# Patient Record
Sex: Male | Born: 1995 | Race: White | Hispanic: No | Marital: Single | Smoking: Former smoker
Health system: Southern US, Community
[De-identification: ages and names within clinical notes are randomized; demographics above are authoritative.]

## PROBLEM LIST (undated history)

## (undated) DIAGNOSIS — E119 Type 2 diabetes mellitus without complications: Secondary | ICD-10-CM

---
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
## 2017-08-25 ENCOUNTER — Emergency Department
Admission: EM | Admit: 2017-08-25 | Discharge: 2017-08-25 | Disposition: A | Discharge status: Home / Self Care | Payer: Medicaid Other | Attending: Emergency Medicine | Admitting: Emergency Medicine

## 2017-08-25 DIAGNOSIS — F17200 Nicotine dependence, unspecified, uncomplicated: Secondary | ICD-10-CM | POA: Diagnosis not present

## 2017-08-25 DIAGNOSIS — Z9114 Patient's other noncompliance with medication regimen: Secondary | ICD-10-CM | POA: Diagnosis not present

## 2017-08-25 DIAGNOSIS — E1165 Type 2 diabetes mellitus with hyperglycemia: Secondary | ICD-10-CM | POA: Diagnosis not present

## 2017-08-25 DIAGNOSIS — R350 Frequency of micturition: Secondary | ICD-10-CM | POA: Diagnosis present

## 2017-08-25 DIAGNOSIS — R739 Hyperglycemia, unspecified: Secondary | ICD-10-CM

## 2017-08-25 HISTORY — DX: Type 2 diabetes mellitus without complications: E11.9

## 2017-08-25 LAB — BASIC METABOLIC PANEL
Anion gap: 12 (ref 5–15)
BUN: 16 mg/dL (ref 6–20)
CALCIUM: 9.6 mg/dL (ref 8.9–10.3)
CHLORIDE: 100 mmol/L — AB (ref 101–111)
CO2: 26 mmol/L (ref 22–32)
CREATININE: 0.68 mg/dL (ref 0.61–1.24)
Glucose, Bld: 283 mg/dL — ABNORMAL HIGH (ref 65–99)
Potassium: 4.2 mmol/L (ref 3.5–5.1)
SODIUM: 138 mmol/L (ref 135–145)

## 2017-08-25 LAB — GLUCOSE, CAPILLARY
Glucose-Capillary: 328 mg/dL — ABNORMAL HIGH (ref 65–99)
Glucose-Capillary: 258 mg/dL — ABNORMAL HIGH (ref 65–99)

## 2017-08-25 LAB — URINALYSIS, COMPLETE (UACMP) WITH MICROSCOPIC
Bacteria, UA: NONE SEEN
Bilirubin Urine: NEGATIVE
HGB URINE DIPSTICK: NEGATIVE
KETONES UR: 5 mg/dL — AB
LEUKOCYTES UA: NEGATIVE
Nitrite: NEGATIVE
PH: 7 (ref 5.0–8.0)
Protein, ur: NEGATIVE mg/dL
SQUAMOUS EPITHELIAL / LPF: NONE SEEN
Specific Gravity, Urine: 1.03 (ref 1.005–1.030)

## 2017-08-25 LAB — CBC
HCT: 46 % (ref 40.0–52.0)
Hemoglobin: 16.1 g/dL (ref 13.0–18.0)
MCH: 30.5 pg (ref 26.0–34.0)
MCHC: 34.9 g/dL (ref 32.0–36.0)
MCV: 87.5 fL (ref 80.0–100.0)
PLATELETS: 346 10*3/uL (ref 150–440)
RBC: 5.26 MIL/uL (ref 4.40–5.90)
RDW: 12.8 % (ref 11.5–14.5)
WBC: 9.8 10*3/uL (ref 3.8–10.6)

## 2017-08-25 LAB — BLOOD GAS, VENOUS
Acid-Base Excess: 0.8 mmol/L (ref 0.0–2.0)
BICARBONATE: 24.5 mmol/L (ref 20.0–28.0)
O2 SAT: 87.2 %
PATIENT TEMPERATURE: 37
PO2 VEN: 51 mmHg — AB (ref 32.0–45.0)
pCO2, Ven: 36 mmHg — ABNORMAL LOW (ref 44.0–60.0)
pH, Ven: 7.44 — ABNORMAL HIGH (ref 7.250–7.430)

## 2017-08-25 MED ORDER — SODIUM CHLORIDE 0.9 % IV BOLUS (SEPSIS)
1000.0000 mL | Freq: Once | INTRAVENOUS | Status: AC
  Administered 2017-08-25: 1000 mL via INTRAVENOUS

## 2017-08-25 NOTE — ED Provider Notes (Signed)
[facility] Emergency Department Provider Note  ____________________________________________   First MD Initiated Contact with Patient 08/25/17 2141     (approximate)  I have reviewed the triage vital signs and the nursing notes.   HISTORY  Chief Complaint Hyperglycemia   HPI Marcus Santos is a 21 y.o. male who comes to the emergency department with roughly 1 week of polyuria, polydipsia, and vague tingling in his hands. His past medical history of type 1 diabetes and he is intermittently compliant with his insulin. He does take his Lantus every night but he does not take his sliding scale very well.history of symptoms have been insidious in onset gradually slowly progressive.   Past Medical History:  Diagnosis Date  . Diabetes mellitus without complication (HCC)     There are no active problems to display for this patient.   History reviewed. No pertinent surgical history.  Prior to Admission medications   Not on File    Allergies Dayquil [pseudoephedrine-apap-dm] and Nyquil multi-symptom [pseudoeph-doxylamine-dm-apap]  No family history on file.  Social History Social History  Substance Use Topics  . Smoking status: Current Some Day Smoker  . Smokeless tobacco: Current User  . Alcohol use No    Review of Systems Constitutional: No fever/chills Eyes: No visual changes. ENT: No sore throat. Cardiovascular: Denies chest pain. Respiratory: Denies shortness of breath. Gastrointestinal: No abdominal pain.  No nausea, no vomiting.  No diarrhea.  No constipation. Genitourinary: Negative for dysuria. Musculoskeletal: Negative for back pain. Skin: Negative for rash. Neurological: Negative for headaches, focal weakness or numbness.   ____________________________________________   PHYSICAL EXAM:  VITAL SIGNS: ED Triage Vitals  Enc Vitals Group     BP 08/25/17 2116 134/71     Pulse Rate 08/25/17 2116 67     Resp 08/25/17 2116  18     Temp 08/25/17 2116 98.9 F (37.2 C)     Temp Source 08/25/17 2116 Oral     SpO2 08/25/17 2116 100 %     Weight 08/25/17 2123 175 lb (79.4 kg)     Height 08/25/17 2123  (1.702 m)     Head Circumference --      Peak Flow --      Pain Score 08/25/17 2122 7     Pain Loc --      Pain Edu? --      Excl. in GC? --     Constitutional: alert and oriented 4 well appearing nontoxic no diaphoresis speaks full clear sentences Eyes: PERRL EOMI. Head: Atraumatic. Nose: No congestion/rhinnorhea. Mouth/Throat: No trismus Neck: No stridor.   Cardiovascular: Normal rate, regular rhythm. Grossly normal heart sounds.  Good peripheral circulation. Respiratory: Normal respiratory effort.  No retractions. Lungs CTAB and moving good air Gastrointestinal: soft nontender Musculoskeletal: No lower extremity edema   Neurologic:  Normal speech and language. No gross focal neurologic deficits are appreciated. Skin:  Skin is warm, dry and intact. No rash noted. Psychiatric: Mood and affect are normal. Speech and behavior are normal.    ____________________________________________   DIFFERENTIAL includes but not limited to  hyperglycemia, diabetic ketoacidosis, HHS, mentation noncompliance ____________________________________________   LABS (all labs ordered are listed, but only abnormal results are displayed)  Labs Reviewed  BASIC METABOLIC PANEL - Abnormal; Notable for the following:       Result Value   Chloride 100 (*)    Glucose, Bld 283 (*)    All other components within normal limits  URINALYSIS, COMPLETE (UACMP) WITH  MICROSCOPIC - Abnormal; Notable for the following:    Color, Urine STRAW (*)    APPearance CLEAR (*)    Glucose, UA >=500 (*)    Ketones, ur 5 (*)    All other components within normal limits  BLOOD GAS, VENOUS - Abnormal; Notable for the following:    pH, Ven 7.44 (*)    pCO2, Ven 36 (*)    pO2, Ven 51.0 (*)    All other components within normal limits    GLUCOSE, CAPILLARY - Abnormal; Notable for the following:    Glucose-Capillary 328 (*)    All other components within normal limits  GLUCOSE, CAPILLARY - Abnormal; Notable for the following:    Glucose-Capillary 258 (*)    All other components within normal limits  CBC  CBG MONITORING, ED    blood work reviewed and interpreted by me shows elevated glucose with no signs of diabetic ketoacidosis __________________________________________  EKG   ____________________________________________  RADIOLOGY   ____________________________________________   PROCEDURES  Procedure(s) performed: no  Procedures  Critical Care performed: no  Observation: no ____________________________________________   INITIAL IMPRESSION / ASSESSMENT AND PLAN / ED COURSE  Pertinent labs & imaging results that were available during my care of the patient were reviewed by me and considered in my medical decision making (see chart for details).  The patient arrives comfortable appearing with no Kussmal breathing and no ketones on his breath. Blood work confirms elevated blood glucose with no signs of diabetic ketoacidosis. I've encouraged the patient to remain compliant with his medications and he verbalizes understanding and agreement. He does not need refills on any medications. He is discharged home in improved condition.      ____________________________________________   FINAL CLINICAL IMPRESSION(S) / ED DIAGNOSES  Final diagnoses:  Hyperglycemia  Nonadherence to medication      NEW MEDICATIONS STARTED DURING THIS VISIT:  New Prescriptions   No medications on file     Note:  This document was prepared using Dragon voice recognition software and may include unintentional dictation errors.     [name], MD 08/25/17 2311

## 2017-08-25 NOTE — ED Notes (Signed)
ED Provider at bedside. 

## 2017-08-25 NOTE — ED Triage Notes (Signed)
Patient is type 1 diabetic. Patient is medication noncompliant. Pt reports sugars over 300 at home today. Patient reports 'shocking' sensation along hands, face.  Patient reports blurred vision and malaise. Pt reports intermittent near syncopal episodes.

## 2017-08-25 NOTE — Discharge Instructions (Signed)
Please make sure you take your insulin as prescribed. It is extremely dangerous to have a hemoglobin A1c as high as yours is. follow-up with your primary care physician as scheduled and return to the emergency department for any concerns.  It was a pleasure to take care of you today, and thank you for coming to our emergency department.  If you have any questions or concerns before leaving please ask the nurse to grab me and I'm more than happy to go through your aftercare instructions again.  If you were prescribed any opioid pain medication today such as Norco, Vicodin, Percocet, morphine, hydrocodone, or oxycodone please make sure you do not drive when you are taking this medication as it can alter your ability to drive safely.  If you have any concerns once you are home that you are not improving or are in fact getting worse before you can make it to your follow-up appointment, please do not hesitate to call 911 and come back for further evaluation.  [name], MD  Results for orders placed or performed during the hospital encounter of 08/25/17  Basic metabolic panel  Result Value Ref Range   Sodium 138 135 - 145 mmol/L   Potassium 4.2 3.5 - 5.1 mmol/L   Chloride 100 (L) 101 - 111 mmol/L   CO2 26 22 - 32 mmol/L   Glucose, Bld 283 (H) 65 - 99 mg/dL   BUN 16 6 - 20 mg/dL   Creatinine, Ser 1.61 0.61 - 1.24 mg/dL   Calcium 9.6 8.9 - 09.6 mg/dL   GFR calc non Af Amer >60 >60 mL/min   GFR calc Af Amer >60 >60 mL/min   Anion gap 12 5 - 15  CBC  Result Value Ref Range   WBC 9.8 3.8 - 10.6 K/uL   RBC 5.26 4.40 - 5.90 MIL/uL   Hemoglobin 16.1 13.0 - 18.0 g/dL   HCT 04.5 40.9 - 81.1 %   MCV 87.5 80.0 - 100.0 fL   MCH 30.5 26.0 - 34.0 pg   MCHC 34.9 32.0 - 36.0 g/dL   RDW 91.4 78.2 - 95.6 %   Platelets 346 150 - 440 K/uL  Urinalysis, Complete w Microscopic  Result Value Ref Range   Color, Urine STRAW (A) YELLOW   APPearance CLEAR (A) CLEAR   Specific Gravity, Urine 1.030 1.005 -  1.030   pH 7.0 5.0 - 8.0   Glucose, UA >=500 (A) NEGATIVE mg/dL   Hgb urine dipstick NEGATIVE NEGATIVE   Bilirubin Urine NEGATIVE NEGATIVE   Ketones, ur 5 (A) NEGATIVE mg/dL   Protein, ur NEGATIVE NEGATIVE mg/dL   Nitrite NEGATIVE NEGATIVE   Leukocytes, UA NEGATIVE NEGATIVE   RBC / HPF 0-5 0 - 5 RBC/hpf   WBC, UA 0-5 0 - 5 WBC/hpf   Bacteria, UA NONE SEEN NONE SEEN   Squamous Epithelial / LPF NONE SEEN NONE SEEN  Blood gas, venous  Result Value Ref Range   pH, Ven 7.44 (H) 7.250 - 7.430   pCO2, Ven 36 (L) 44.0 - 60.0 mmHg   pO2, Ven 51.0 (H) 32.0 - 45.0 mmHg   Bicarbonate 24.5 20.0 - 28.0 mmol/L   Acid-Base Excess 0.8 0.0 - 2.0 mmol/L   O2 Saturation 87.2 %   Patient temperature 37.0    Collection site LINE    Sample type VENOUS   Glucose, capillary  Result Value Ref Range   Glucose-Capillary 328 (H) 65 - 99 mg/dL   Comment 1 Notify RN

## 2017-10-22 ENCOUNTER — Emergency Department
Admission: EM | Admit: 2017-10-22 | Discharge: 2017-10-22 | Disposition: A | Discharge status: Home / Self Care | Payer: Medicaid Other | Attending: Emergency Medicine | Admitting: Emergency Medicine

## 2017-10-22 ENCOUNTER — Encounter: Payer: Self-pay | Admitting: Emergency Medicine

## 2017-10-22 DIAGNOSIS — E1065 Type 1 diabetes mellitus with hyperglycemia: Secondary | ICD-10-CM | POA: Insufficient documentation

## 2017-10-22 DIAGNOSIS — R531 Weakness: Secondary | ICD-10-CM | POA: Diagnosis not present

## 2017-10-22 DIAGNOSIS — F17210 Nicotine dependence, cigarettes, uncomplicated: Secondary | ICD-10-CM | POA: Insufficient documentation

## 2017-10-22 DIAGNOSIS — R739 Hyperglycemia, unspecified: Secondary | ICD-10-CM

## 2017-10-22 DIAGNOSIS — R002 Palpitations: Secondary | ICD-10-CM | POA: Diagnosis present

## 2017-10-22 LAB — CBC
HEMATOCRIT: 51.8 % (ref 40.0–52.0)
Hemoglobin: 17.5 g/dL (ref 13.0–18.0)
MCH: 30.4 pg (ref 26.0–34.0)
MCHC: 33.7 g/dL (ref 32.0–36.0)
MCV: 90.1 fL (ref 80.0–100.0)
PLATELETS: 420 10*3/uL (ref 150–440)
RBC: 5.75 MIL/uL (ref 4.40–5.90)
RDW: 12.8 % (ref 11.5–14.5)
WBC: 16.1 10*3/uL — ABNORMAL HIGH (ref 3.8–10.6)

## 2017-10-22 LAB — BASIC METABOLIC PANEL
Anion gap: 11 (ref 5–15)
BUN: 19 mg/dL (ref 6–20)
CO2: 24 mmol/L (ref 22–32)
CREATININE: 0.79 mg/dL (ref 0.61–1.24)
Calcium: 9.9 mg/dL (ref 8.9–10.3)
Chloride: 97 mmol/L — ABNORMAL LOW (ref 101–111)
GFR calc Af Amer: >60 mL/min (ref >60)
GLUCOSE: 325 mg/dL — AB (ref 65–99)
POTASSIUM: 4.2 mmol/L (ref 3.5–5.1)
SODIUM: 132 mmol/L — AB (ref 135–145)

## 2017-10-22 LAB — GLUCOSE, CAPILLARY: Glucose-Capillary: 336 mg/dL — ABNORMAL HIGH (ref 65–99)

## 2017-10-22 MED ORDER — SODIUM CHLORIDE 0.9 % IV BOLUS (SEPSIS)
1000.0000 mL | Freq: Once | INTRAVENOUS | Status: AC
  Administered 2017-10-22: 1000 mL via INTRAVENOUS

## 2017-10-22 NOTE — ED Triage Notes (Signed)
Patient presents to ED via POV from [facility] due to hyperglycemia. CBG at [facility] 444, here CBG is 336. Patient reports nausea and "feeling crappy". Patient states he normally misses 1 dose of his insulin a day.

## 2017-10-22 NOTE — ED Notes (Signed)
ekg done, nsr.  Pt in nad and has already been seen by MD.

## 2017-10-22 NOTE — ED Provider Notes (Signed)
[facility] Emergency Department Provider Note  ____________________________________________   First MD Initiated Contact with Patient 10/22/17 1403     (approximate)  I have reviewed the triage vital signs and the nursing notes.   HISTORY  Chief Complaint Hyperglycemia   HPI Marcus Santos is a 21 y.o. male with a history of type 1 diabetes was presented to the emergency department today with several weeks of chills, palpitations as well as weakness.  He says that he misses 1-2 doses of his diabetes medications twice a day and drinks sodas regularly.  He denies any pain at this time.  Says that he is also been having blurred vision intermittently but reports vision is normal now.   Past Medical History:  Diagnosis Date  . Diabetes mellitus without complication (HCC)     There are no active problems to display for this patient.   History reviewed. No pertinent surgical history.  Prior to Admission medications   Not on File    Allergies Dayquil [pseudoephedrine-apap-dm] and Nyquil multi-symptom [pseudoeph-doxylamine-dm-apap]  No family history on file.  Social History Social History   Tobacco Use  . Smoking status: Current Every Day Smoker    Packs/day: 0.50    Types: Cigarettes  . Smokeless tobacco: Former Engineer, waterUser  Substance Use Topics  . Alcohol use: No  . Drug use: Yes    Types: Marijuana    Review of Systems  Constitutional: As above Eyes: As above  NT: No sore throat. Cardiovascular: Denies chest pain. Respiratory: Denies shortness of breath. Gastrointestinal: No abdominal pain.  No nausea, no vomiting.  No diarrhea.  No constipation. Genitourinary: Negative for dysuria. Musculoskeletal: Negative for back pain. Skin: Negative for rash. Neurological: Negative for headaches, focal weakness or numbness.   ____________________________________________   PHYSICAL EXAM:  VITAL SIGNS: ED Triage Vitals [10/22/17 1227]    Enc Vitals Group     BP (!) 150/94     Pulse Rate 96     Resp 16     Temp 98.9 F (37.2 C)     Temp Source Oral     SpO2 98 %     Weight 194 lb (88 kg)     Height 5\' 7"  (1.702 m)     Head Circumference      Peak Flow      Pain Score      Pain Loc      Pain Edu?      Excl. in GC?     Constitutional: Alert and oriented. Well appearing and in no acute distress. Eyes: Conjunctivae are normal.  Head: Atraumatic. Nose: No congestion/rhinnorhea. Mouth/Throat: Mucous membranes are moist.  Neck: No stridor.   Cardiovascular: Normal rate, regular rhythm. Grossly normal heart sounds.   Respiratory: Normal respiratory effort.  No retractions. Lungs CTAB. Gastrointestinal: Soft and nontender. No distention.  Musculoskeletal: No lower extremity tenderness nor edema.  No joint effusions. Neurologic:  Normal speech and language. No gross focal neurologic deficits are appreciated. Skin:  Skin is warm, dry and intact. No rash noted. Psychiatric: Mood and affect are normal. Speech and behavior are normal.  ____________________________________________   LABS (all labs ordered are listed, but only abnormal results are displayed)  Labs Reviewed  BASIC METABOLIC PANEL - Abnormal; Notable for the following components:      Result Value   Sodium 132 (*)    Chloride 97 (*)    Glucose, Bld 325 (*)    All other components within normal limits  CBC - Abnormal; Notable for the following components:   WBC 16.1 (*)    All other components within normal limits  GLUCOSE, CAPILLARY - Abnormal; Notable for the following components:   Glucose-Capillary 336 (*)    All other components within normal limits  URINALYSIS, COMPLETE (UACMP) WITH MICROSCOPIC  CBG MONITORING, ED   ____________________________________________  EKG  ED ECG REPORT I, Arelia Longestavid M Noni Stonesifer, the attending physician, personally viewed and interpreted this ECG.   Date: 10/22/2017  EKG Time: 1421  Rate: 75  Rhythm: normal  sinus rhythm  Axis: Normal  Intervals:none  ST&T Change: No ST segment elevation or depression.  No abnormal T wave inversion.  ____________________________________________  RADIOLOGY   ____________________________________________   PROCEDURES  Procedure(s) performed:   Procedures  Critical Care performed:   ____________________________________________   INITIAL IMPRESSION / ASSESSMENT AND PLAN / ED COURSE  Pertinent labs & imaging results that were available during my care of the patient were reviewed by me and considered in my medical decision making (see chart for details).  DX: DKA, hyperglycemia, sepsis, palpitations, WPW, SVT  As part of my medical decision making, I reviewed the following data within the electronic MEDICAL RECORD NUMBER Notes from prior ED visits  The patient with very reassuring EKG.  I discussed making sure that the patient should stay consistent with his insulin dosing.  He also knows that he should be reducing his amount of sugar intake.  I believe that the first thing that we should do in this case is make sure that the patient is maintaining his medication regimen and eating a better diet.  Very reassuring workup today but we did discuss the long-term implications of out of control sugar with his diabetes.  The patient is understanding of this plan willing to comply.  Will be discharged home.        ____________________________________________   FINAL CLINICAL IMPRESSION(S) / ED DIAGNOSES  Hyperglycemia    NEW MEDICATIONS STARTED DURING THIS VISIT:  This SmartLink is deprecated. Use AVSMEDLIST instead to display the medication list for a patient.   Note:  This document was prepared using Dragon voice recognition software and may include unintentional dictation errors.     Myrna BlazerSchaevitz, Daron Breeding Matthew, MD 10/22/17 [phone]

## 2019-10-11 ENCOUNTER — Emergency Department: Payer: Medicaid Other

## 2019-10-11 ENCOUNTER — Other Ambulatory Visit: Payer: Self-pay

## 2019-10-11 ENCOUNTER — Emergency Department
Admission: EM | Admit: 2019-10-11 | Discharge: 2019-10-11 | Disposition: A | Discharge status: Home / Self Care | Payer: Medicaid Other | Attending: Emergency Medicine | Admitting: Emergency Medicine

## 2019-10-11 ENCOUNTER — Encounter: Payer: Self-pay | Admitting: Intensive Care

## 2019-10-11 DIAGNOSIS — X58XXXA Exposure to other specified factors, initial encounter: Secondary | ICD-10-CM | POA: Insufficient documentation

## 2019-10-11 DIAGNOSIS — Y999 Unspecified external cause status: Secondary | ICD-10-CM | POA: Insufficient documentation

## 2019-10-11 DIAGNOSIS — Z79899 Other long term (current) drug therapy: Secondary | ICD-10-CM | POA: Diagnosis not present

## 2019-10-11 DIAGNOSIS — Y939 Activity, unspecified: Secondary | ICD-10-CM | POA: Insufficient documentation

## 2019-10-11 DIAGNOSIS — Y929 Unspecified place or not applicable: Secondary | ICD-10-CM | POA: Insufficient documentation

## 2019-10-11 DIAGNOSIS — Z20828 Contact with and (suspected) exposure to other viral communicable diseases: Secondary | ICD-10-CM | POA: Insufficient documentation

## 2019-10-11 DIAGNOSIS — S39012A Strain of muscle, fascia and tendon of lower back, initial encounter: Secondary | ICD-10-CM | POA: Diagnosis not present

## 2019-10-11 DIAGNOSIS — R05 Cough: Secondary | ICD-10-CM | POA: Insufficient documentation

## 2019-10-11 DIAGNOSIS — E1065 Type 1 diabetes mellitus with hyperglycemia: Secondary | ICD-10-CM | POA: Insufficient documentation

## 2019-10-11 DIAGNOSIS — F17290 Nicotine dependence, other tobacco product, uncomplicated: Secondary | ICD-10-CM | POA: Diagnosis not present

## 2019-10-11 DIAGNOSIS — R059 Cough, unspecified: Secondary | ICD-10-CM

## 2019-10-11 LAB — CBC
HCT: 45.1 % (ref 39.0–52.0)
Hemoglobin: 15.6 g/dL (ref 13.0–17.0)
MCH: 30.1 pg (ref 26.0–34.0)
MCHC: 34.6 g/dL (ref 30.0–36.0)
MCV: 86.9 fL (ref 80.0–100.0)
Platelets: 366 10*3/uL (ref 150–400)
RBC: 5.19 MIL/uL (ref 4.22–5.81)
RDW: 11.5 % (ref 11.5–15.5)
WBC: 11.3 10*3/uL — ABNORMAL HIGH (ref 4.0–10.5)
nRBC: 0 % (ref 0.0–0.2)

## 2019-10-11 LAB — URINALYSIS, COMPLETE (UACMP) WITH MICROSCOPIC
Bacteria, UA: NONE SEEN
Bilirubin Urine: NEGATIVE
Glucose, UA: >=500 mg/dL — AB
Hgb urine dipstick: NEGATIVE
Ketones, ur: 20 mg/dL — AB
Leukocytes,Ua: NEGATIVE
Nitrite: NEGATIVE
Protein, ur: NEGATIVE mg/dL
Specific Gravity, Urine: 1.035 — ABNORMAL HIGH (ref 1.005–1.030)
Squamous Epithelial / LPF: NONE SEEN (ref 0–5)
pH: 7 (ref 5.0–8.0)

## 2019-10-11 LAB — BASIC METABOLIC PANEL
Anion gap: 15 (ref 5–15)
BUN: 11 mg/dL (ref 6–20)
CO2: 20 mmol/L — ABNORMAL LOW (ref 22–32)
Calcium: 9.3 mg/dL (ref 8.9–10.3)
Chloride: 100 mmol/L (ref 98–111)
Creatinine, Ser: 0.9 mg/dL (ref 0.61–1.24)
GFR calc Af Amer: >60 mL/min (ref >60)
GFR calc non Af Amer: >60 mL/min (ref >60)
Glucose, Bld: 467 mg/dL — ABNORMAL HIGH (ref 70–99)
Potassium: 3.8 mmol/L (ref 3.5–5.1)
Sodium: 135 mmol/L (ref 135–145)

## 2019-10-11 LAB — GLUCOSE, CAPILLARY
Glucose-Capillary: 403 mg/dL — ABNORMAL HIGH (ref 70–99)
Glucose-Capillary: 259 mg/dL — ABNORMAL HIGH (ref 70–99)

## 2019-10-11 LAB — TROPONIN I (HIGH SENSITIVITY): Troponin I (High Sensitivity): 3 ng/L (ref <18)

## 2019-10-11 MED ORDER — BENZONATATE 100 MG PO CAPS: ORAL_CAPSULE | ORAL | 0 refills | Status: DC

## 2019-10-11 MED ORDER — AZITHROMYCIN 250 MG PO TABS: ORAL_TABLET | ORAL | 0 refills | Status: DC

## 2019-10-11 MED ORDER — CYCLOBENZAPRINE HCL 5 MG PO TABS: 5.0000 mg | ORAL_TABLET | Freq: Three times a day (TID) | ORAL | 0 refills | Status: DC | PRN

## 2019-10-11 NOTE — ED Provider Notes (Signed)
[facility] Emergency Department Provider Note ____________________________________________  Time seen: 1532  I have reviewed the triage vital signs and the nursing notes.  HISTORY  Chief Complaint  Hyperglycemia  HPI Marcus Santos is a 23 y.o. male with a history of type 1 diabetes on insulin therapy, presents to the ED with complaint of a 3-day cough and hyperglycemia.  Patient has a history of poor compliance with his insulin therapy.  He complains of cough, runny nose, and generalized body aches.  He denies any fevers, chills, or sweats.  He has unrelated complaints of multiple previous orthopedic injuries, he also reports chronic low back pain which is never been evaluated.  Past Medical History:  Diagnosis Date  . Diabetes mellitus without complication (HCC)     There are no active problems to display for this patient.   History reviewed. No pertinent surgical history.  Prior to Admission medications   Medication Sig Start Date End Date Taking? Authorizing Provider  azithromycin (ZITHROMAX Z-PAK) 250 MG tablet Take 2 tablets (500 mg) on  Day 1,  followed by 1 tablet (250 mg) once daily on Days 2 through 5. 10/11/19   Jetaun Colbath, Charlesetta Ivory, PA-C  benzonatate (TESSALON PERLES) 100 MG capsule Take 1-2 tabs TID prn cough 10/11/19   Lonzo Saulter, Charlesetta Ivory, PA-C  cyclobenzaprine (FLEXERIL) 5 MG tablet Take 1 tablet (5 mg total) by mouth 3 (three) times daily as needed. 10/11/19   Cristhian Vanhook, Charlesetta Ivory, PA-C    Allergies Citalopram, Dayquil [pseudoephedrine-apap-dm], Morphine and related, and Nyquil multi-symptom [pseudoeph-doxylamine-dm-apap]  History reviewed. No pertinent family history.  Social History Social History   Tobacco Use  . Smoking status: Current Every Day Smoker    Packs/day: 0.50    Types: E-cigarettes  . Smokeless tobacco: Former Engineer, water Use Topics  . Alcohol use: No  . Drug use: Yes    Types: Marijuana     Comment: opiods sometimes; CBD    Review of Systems  Constitutional: Negative for fever.  Ports generalized body aches.   Eyes: Negative for visual changes. ENT: Negative for sore throat.  Positive for runny nose. Cardiovascular: Negative for chest pain. Respiratory: Negative for shortness of breath.  Reports nonproductive cough. Gastrointestinal: Negative for abdominal pain, vomiting and diarrhea. Genitourinary: Negative for dysuria. Musculoskeletal: Positive for chronic low back pain. Skin: Negative for rash. Neurological: Negative for headaches, focal weakness or numbness. ____________________________________________  PHYSICAL EXAM:  VITAL SIGNS: ED Triage Vitals  Enc Vitals Group     BP 10/11/19 1312 140/74     Pulse Rate 10/11/19 1312 (!) 101     Resp 10/11/19 1312 17     Temp 10/11/19 1312 99.2 F (37.3 C)     Temp Source 10/11/19 1312 Oral     SpO2 10/11/19 1312 100 %     Weight 10/11/19 1313 160 lb (72.6 kg)     Height 10/11/19 1313 5\' 7"  (1.702 m)     Head Circumference --      Peak Flow --      Pain Score 10/11/19 1321 9     Pain Loc --      Pain Edu? --      Excl. in GC? --     Constitutional: Alert and oriented. Well appearing and in no distress. Head: Normocephalic and atraumatic. Eyes: Conjunctivae are normal. PERRL. Normal extraocular movements Ears: Canals clear. TMs intact bilaterally. Nose: No congestion/rhinorrhea/epistaxis. Mouth/Throat: Mucous membranes are moist. Neck: Supple. No thyromegaly.  Cardiovascular: Normal rate, regular rhythm. Normal distal pulses. Respiratory: Normal respiratory effort. No wheezes/rales/rhonchi. Gastrointestinal: Soft and nontender. No distention. Musculoskeletal: Normal spinal alignment without midline tenderness, spasm, deformity, or step-off.  Patient is mildly tender palpation over the right lumbar sacral junction.  Full active range of motion of the lumbar spine is noted.  Nontender with normal range of motion in  all extremities.  Neurologic:  Normal gait without ataxia. Normal speech and language. No gross focal neurologic deficits are appreciated. Skin:  Skin is warm, dry and intact. No rash noted. Psychiatric: Mood and affect are normal. Patient exhibits appropriate insight and judgment. ____________________________________________   LABS (pertinent positives/negatives) Labs Reviewed  GLUCOSE, CAPILLARY - Abnormal; Notable for the following components:      Result Value   Glucose-Capillary 403 (*)    All other components within normal limits  BASIC METABOLIC PANEL - Abnormal; Notable for the following components:   CO2 20 (*)    Glucose, Bld 467 (*)    All other components within normal limits  CBC - Abnormal; Notable for the following components:   WBC 11.3 (*)    All other components within normal limits  URINALYSIS, COMPLETE (UACMP) WITH MICROSCOPIC - Abnormal; Notable for the following components:   Color, Urine STRAW (*)    APPearance CLEAR (*)    Specific Gravity, Urine 1.035 (*)    Glucose, UA >=500 (*)    Ketones, ur 20 (*)    All other components within normal limits  GLUCOSE, CAPILLARY - Abnormal; Notable for the following components:   Glucose-Capillary 259 (*)    All other components within normal limits  SARS CORONAVIRUS 2 (TAT 6-24 HRS)  CBG MONITORING, ED  CBG MONITORING, ED  TROPONIN I (HIGH SENSITIVITY)  ____________________________________________   RADIOLOGY  CXR Negative  DG Lumbar Spine IMPRESSION: 1. Normal lumbar spine radiographs. 2. Punctate radiodensity over the lower pole left kidney may reflect a small collecting system calculus. ____________________________________________  PROCEDURES  NS 1 L IVP Procedures ____________________________________________  INITIAL IMPRESSION / ASSESSMENT AND PLAN / ED COURSE  Patient with a known history of type 1 diabetes is poorly controlled with insulin, presents to the ED with a 3-day complaint of cough.   His initial blood sugar was 450+, and as well he had some glucosuria.  Patient was treated in the ED with IV fluids and his blood sugars improved to 259.  His chest x-ray is negative and reassuring at this time.  No indication of an acute infectious process.  Given his symptoms and concern, we will treat patient empirically with azithromycin and Tessalon Perle prescription.  He also had a concern for evaluation of his chronic longstanding low back pain.  No previous work-up has been done, his x-rays were performed at his request.  They are negative for any acute findings.  He will be treated with a muscle accident for this particular pain.  He is encouraged to follow-up with his endocrinologist for ongoing symptom management.  He is also encouraged to monitor and treat his blood sugars with insulin as prescribed. Patient evaluated, tested and sent home with instructions for home care and Quarantine. Instructed to seek further care if symptoms worsen.  Return precautions have been reviewed.  Santiago BumpersWilliam A Soots was evaluated in Emergency Department on 10/11/2019 for the symptoms described in the history of present illness. He was evaluated in the context of the global COVID-19 pandemic, which necessitated consideration that the patient might be at risk for infection with the  SARS-CoV-2 virus that causes COVID-19. Institutional protocols and algorithms that pertain to the evaluation of patients at risk for COVID-19 are in a state of rapid change based on information released by regulatory bodies including the CDC and federal and state organizations. These policies and algorithms were followed during the patient's care in the ED. ____________________________________________  FINAL CLINICAL IMPRESSION(S) / ED DIAGNOSES  Final diagnoses:  Hyperglycemia due to type 1 diabetes mellitus ([place])  Cough  Lumbar strain, initial encounter      Melvenia Needles, PA-C 10/11/19 1740    Nance Pear,  MD 10/11/19 1810

## 2019-10-11 NOTE — ED Triage Notes (Signed)
Patient came in today for hyperglycemia and cough X3 days. Patient is type 1 diabetic and is non compliant with insulin daily and checking blood sugars. Blood sugar 403 in triage. IV started and bag of saline hanging. Denies any symptoms

## 2019-10-11 NOTE — ED Notes (Signed)
NAD noted in patient. C/o slight cough for a few days and came in for high blood sugar today after getting a high reading at [facility] when being seen for cough

## 2019-10-11 NOTE — Discharge Instructions (Addendum)
Your exam, labs, and CXR are stable after your ED visit. You will be treated for a probable bronchitis, with Azithromycin, Tessalon Perles, and a muscle relaxant for your back pain. You should check your blood sugars and take you insulin as prescribed. Take the prescription meds as directed. You will be notified only if your COVID test is Positive.

## 2019-10-12 LAB — SARS CORONAVIRUS 2 (TAT 6-24 HRS): SARS Coronavirus 2: NEGATIVE

## 2019-10-19 ENCOUNTER — Emergency Department
Admission: EM | Admit: 2019-10-19 | Discharge: 2019-10-19 | Disposition: A | Discharge status: Home / Self Care | Payer: Medicaid Other | Attending: Emergency Medicine | Admitting: Emergency Medicine

## 2019-10-19 ENCOUNTER — Encounter: Payer: Self-pay | Admitting: Intensive Care

## 2019-10-19 ENCOUNTER — Other Ambulatory Visit: Payer: Self-pay

## 2019-10-19 DIAGNOSIS — S81832A Puncture wound without foreign body, left lower leg, initial encounter: Secondary | ICD-10-CM | POA: Diagnosis present

## 2019-10-19 DIAGNOSIS — E119 Type 2 diabetes mellitus without complications: Secondary | ICD-10-CM | POA: Insufficient documentation

## 2019-10-19 DIAGNOSIS — W540XXA Bitten by dog, initial encounter: Secondary | ICD-10-CM | POA: Diagnosis not present

## 2019-10-19 DIAGNOSIS — Y999 Unspecified external cause status: Secondary | ICD-10-CM | POA: Insufficient documentation

## 2019-10-19 DIAGNOSIS — Y9389 Activity, other specified: Secondary | ICD-10-CM | POA: Insufficient documentation

## 2019-10-19 DIAGNOSIS — Y92014 Private driveway to single-family (private) house as the place of occurrence of the external cause: Secondary | ICD-10-CM | POA: Insufficient documentation

## 2019-10-19 DIAGNOSIS — Z23 Encounter for immunization: Secondary | ICD-10-CM | POA: Diagnosis not present

## 2019-10-19 DIAGNOSIS — F17210 Nicotine dependence, cigarettes, uncomplicated: Secondary | ICD-10-CM | POA: Insufficient documentation

## 2019-10-19 LAB — GLUCOSE, CAPILLARY: Glucose-Capillary: 372 mg/dL — ABNORMAL HIGH (ref 70–99)

## 2019-10-19 MED ORDER — HYDROCODONE-ACETAMINOPHEN 5-325 MG PO TABS
1.0000 | ORAL_TABLET | Freq: Once | ORAL | Status: AC
  Administered 2019-10-19: 1 via ORAL
  Filled 2019-10-19: qty 1

## 2019-10-19 MED ORDER — TETANUS-DIPHTH-ACELL PERTUSSIS 5-2.5-18.5 LF-MCG/0.5 IM SUSP
0.5000 mL | Freq: Once | INTRAMUSCULAR | Status: AC
  Administered 2019-10-19: 0.5 mL via INTRAMUSCULAR
  Filled 2019-10-19: qty 0.5

## 2019-10-19 MED ORDER — AMOXICILLIN-POT CLAVULANATE 875-125 MG PO TABS: 1.0000 | ORAL_TABLET | Freq: Two times a day (BID) | ORAL | 0 refills | Status: AC

## 2019-10-19 MED ORDER — AMOXICILLIN-POT CLAVULANATE 875-125 MG PO TABS
1.0000 | ORAL_TABLET | Freq: Once | ORAL | Status: AC
  Administered 2019-10-19: 18:00:00 1 via ORAL
  Filled 2019-10-19: qty 1

## 2019-10-19 MED ORDER — HYDROCODONE-ACETAMINOPHEN 5-325 MG PO TABS: 1.0000 | ORAL_TABLET | Freq: Three times a day (TID) | ORAL | 0 refills | Status: AC | PRN

## 2019-10-19 NOTE — ED Notes (Signed)
See triage note  Presents with dog bite to left lower leg  And then hit the "fridge with right hand   Abrasion noted to lateral aspect of hand with some swelling

## 2019-10-19 NOTE — ED Provider Notes (Signed)
[facility] Emergency Department Provider Note ____________________________________________  Time seen: 1740  I have reviewed the triage vital signs and the nursing notes.  HISTORY  Chief Complaint  Animal Bite  HPI Marcus Santos is a 23 y.o. male presents to the ED from home, via EMS, after an unprovoked dog bite.  Patient describes his neighbor's [place] shepherds were loose, and running the neighborhood.  He came out to retrieve something from his truck, when he was bitten on the posterior left calf.  He presents now for evaluation of a single puncture wound to the calf, and multiple abrasions thereabout.  He denies any other injury at this time.  Patient reports that at the time he left about an hour ago animal control was in the area attempting to capture the 2 dogs.  Patient is a known type I diabetic, and is poorly compliant with therapy.   Past Medical History:  Diagnosis Date  . Diabetes mellitus without complication (HCC)     There are no active problems to display for this patient.   History reviewed. No pertinent surgical history.  Prior to Admission medications   Medication Sig Start Date End Date Taking? Authorizing Provider  amoxicillin-clavulanate (AUGMENTIN) 875-125 MG tablet Take 1 tablet by mouth 2 (two) times daily for 10 days. 10/19/19 10/29/19  Matteus Mcnelly, Charlesetta Ivory, PA-C  HYDROcodone-acetaminophen (NORCO) 5-325 MG tablet Take 1 tablet by mouth 3 (three) times daily as needed for up to 2 days. 10/19/19 10/21/19  Ahmere Hemenway, Charlesetta Ivory, PA-C    Allergies Citalopram, Dayquil [pseudoephedrine-apap-dm], Morphine and related, and Nyquil multi-symptom [pseudoeph-doxylamine-dm-apap]  History reviewed. No pertinent family history.  Social History Social History   Tobacco Use  . Smoking status: Current Every Day Smoker    Packs/day: 0.50    Types: E-cigarettes  . Smokeless tobacco: Former Engineer, water Use Topics  . Alcohol  use: Yes  . Drug use: Yes    Types: Marijuana    Comment: opiods sometimes; CBD    Review of Systems  Constitutional: Negative for fever. Eyes: Negative for visual changes. ENT: Negative for sore throat. Cardiovascular: Negative for chest pain. Respiratory: Negative for shortness of breath. Gastrointestinal: Negative for abdominal pain, vomiting and diarrhea. Genitourinary: Negative for dysuria. Musculoskeletal: Negative for back pain. Skin: Negative for rash.  Dog bite to the left calf as above. Neurological: Negative for headaches, focal weakness or numbness. ____________________________________________  PHYSICAL EXAM:  VITAL SIGNS: ED Triage Vitals  Enc Vitals Group     BP 10/19/19 1727 (!) 138/96     Pulse Rate 10/19/19 1727 (!) 123     Resp 10/19/19 1727 16     Temp 10/19/19 1727 98 F (36.7 C)     Temp Source 10/19/19 1727 Oral     SpO2 10/19/19 1727 99 %     Weight 10/19/19 1718 164 lb (74.4 kg)     Height 10/19/19 1718 5\' 7"  (1.702 m)     Head Circumference --      Peak Flow --      Pain Score 10/19/19 1717 10     Pain Loc --      Pain Edu? --      Excl. in GC? --     Constitutional: Alert and oriented. Well appearing and in no distress. Head: Normocephalic and atraumatic. Eyes: Conjunctivae are normal. Normal extraocular movements Cardiovascular: Normal rate, regular rhythm. Normal distal pulses. Respiratory: Normal respiratory effort. No wheezes/rales/rhonchi. Gastrointestinal: Soft and nontender. No distention.  Musculoskeletal: Left hand without any obvious deformity, dislocation, muscle deformity, or soft tissue defects.  Patient with a single puncture wound to the lateral calf, multiple abrasions to the lower leg and calf.  Knee exam is benign without any signs of internal derangement.  Nontender with normal range of motion in all extremities.  Neurologic: Normal gross sensation.  Antalgic gait without ataxia. Normal speech and language. No gross focal  neurologic deficits are appreciated. Skin:  Skin is warm, dry and intact. No rash noted. ____________________________________________  PROCEDURES  Tdap 0.5 ml IM Augmentin 875 mg PO Norco 5-325 mg p.o. Wound care Dressing crutches Procedures ____________________________________________  INITIAL IMPRESSION / ASSESSMENT AND PLAN / ED COURSE  Patient presents to the ED for evaluation of an unprovoked dog bite attack.  Patient sustained a bite to the posterior calf.  Exam is overall benign reassuring at this time.  He reports that the animals are in the process of being captured by animal control.  Patient is treated empirically with Augmentin for his dog bite prophylaxis.  A small prescription (#6) of hydrocodone is also provided for pain relief benefit.  A work note is provided for 2 days.  Patient will follow-up with animal control for definitive disposition regarding rabies vaccine.  Return precautions have been reviewed.  Marcus Santos was evaluated in Emergency Department on 10/19/2019 for the symptoms described in the history of present illness. He was evaluated in the context of the global COVID-19 pandemic, which necessitated consideration that the patient might be at risk for infection with the SARS-CoV-2 virus that causes COVID-19. Institutional protocols and algorithms that pertain to the evaluation of patients at risk for COVID-19 are in a state of rapid change based on information released by regulatory bodies including the CDC and federal and state organizations. These policies and algorithms were followed during the patient's care in the ED. ____________________________________________  FINAL CLINICAL IMPRESSION(S) / ED DIAGNOSES  Final diagnoses:  Dog bite, initial encounter      Melvenia Needles, PA-C 10/19/19 Greer Ee    Nance Pear, MD 10/19/19 [phone]

## 2019-10-19 NOTE — Discharge Instructions (Signed)
Keep the wounds clean, dry, and covered. Take the antibiotic as directed, and the pain medicine as needed. Rest with the leg elevated. Follow-up with your provider for ongoing symptoms. Return to the ED as needed.

## 2019-10-19 NOTE — ED Triage Notes (Signed)
Arrived by EMS after being attacked by dog. Bite marks in left calf.

## 2019-10-21 ENCOUNTER — Ambulatory Visit
Admission: RE | Admit: 2019-10-21 | Discharge: 2019-10-21 | Disposition: A | Discharge status: Home / Self Care | Payer: Medicaid Other | Attending: Family Medicine | Admitting: Family Medicine

## 2019-10-21 ENCOUNTER — Other Ambulatory Visit: Payer: Self-pay

## 2019-10-21 ENCOUNTER — Ambulatory Visit
Admission: RE | Admit: 2019-10-21 | Discharge: 2019-10-21 | Disposition: A | Discharge status: Home / Self Care | Payer: Medicaid Other | Source: Ambulatory Visit | Attending: Family Medicine | Admitting: Family Medicine

## 2019-10-21 ENCOUNTER — Other Ambulatory Visit: Payer: Self-pay | Admitting: Family Medicine

## 2019-10-21 DIAGNOSIS — W540XXA Bitten by dog, initial encounter: Secondary | ICD-10-CM

## 2019-10-21 DIAGNOSIS — W540XXD Bitten by dog, subsequent encounter: Secondary | ICD-10-CM | POA: Insufficient documentation

## 2019-10-21 DIAGNOSIS — M79641 Pain in right hand: Secondary | ICD-10-CM | POA: Insufficient documentation

## 2020-05-03 ENCOUNTER — Other Ambulatory Visit: Payer: Self-pay

## 2020-05-03 ENCOUNTER — Emergency Department
Admission: EM | Admit: 2020-05-03 | Discharge: 2020-05-03 | Disposition: A | Discharge status: Home / Self Care | Payer: Medicaid Other | Attending: Emergency Medicine | Admitting: Emergency Medicine

## 2020-05-03 ENCOUNTER — Emergency Department: Payer: Medicaid Other

## 2020-05-03 ENCOUNTER — Encounter: Payer: Self-pay | Admitting: *Deleted

## 2020-05-03 DIAGNOSIS — E119 Type 2 diabetes mellitus without complications: Secondary | ICD-10-CM | POA: Insufficient documentation

## 2020-05-03 DIAGNOSIS — J029 Acute pharyngitis, unspecified: Secondary | ICD-10-CM | POA: Diagnosis not present

## 2020-05-03 DIAGNOSIS — R079 Chest pain, unspecified: Secondary | ICD-10-CM

## 2020-05-03 DIAGNOSIS — Z7984 Long term (current) use of oral hypoglycemic drugs: Secondary | ICD-10-CM | POA: Insufficient documentation

## 2020-05-03 DIAGNOSIS — R0789 Other chest pain: Secondary | ICD-10-CM | POA: Diagnosis present

## 2020-05-03 DIAGNOSIS — F17210 Nicotine dependence, cigarettes, uncomplicated: Secondary | ICD-10-CM | POA: Insufficient documentation

## 2020-05-03 LAB — CBC
HCT: 44 % (ref 39.0–52.0)
Hemoglobin: 15.4 g/dL (ref 13.0–17.0)
MCH: 30.2 pg (ref 26.0–34.0)
MCHC: 35 g/dL (ref 30.0–36.0)
MCV: 86.3 fL (ref 80.0–100.0)
Platelets: 397 10*3/uL (ref 150–400)
RBC: 5.1 MIL/uL (ref 4.22–5.81)
RDW: 11.6 % (ref 11.5–15.5)
WBC: 12.2 10*3/uL — ABNORMAL HIGH (ref 4.0–10.5)
nRBC: 0 % (ref 0.0–0.2)

## 2020-05-03 LAB — BASIC METABOLIC PANEL
Anion gap: 11 (ref 5–15)
BUN: 10 mg/dL (ref 6–20)
CO2: 25 mmol/L (ref 22–32)
Calcium: 8.9 mg/dL (ref 8.9–10.3)
Chloride: 101 mmol/L (ref 98–111)
Creatinine, Ser: 0.94 mg/dL (ref 0.61–1.24)
GFR calc Af Amer: >60 mL/min (ref >60)
GFR calc non Af Amer: >60 mL/min (ref >60)
Glucose, Bld: 380 mg/dL — ABNORMAL HIGH (ref 70–99)
Potassium: 3.9 mmol/L (ref 3.5–5.1)
Sodium: 137 mmol/L (ref 135–145)

## 2020-05-03 LAB — TROPONIN I (HIGH SENSITIVITY): Troponin I (High Sensitivity): 3 ng/L (ref <18)

## 2020-05-03 MED ORDER — CEPHALEXIN 500 MG PO CAPS: 500.0000 mg | ORAL_CAPSULE | Freq: Four times a day (QID) | ORAL | 0 refills | Status: AC

## 2020-05-03 NOTE — ED Provider Notes (Signed)
[facility] Emergency Department Provider Note  Time seen: 9:30 PM  I have reviewed the triage vital signs and the nursing notes.   HISTORY  Chief Complaint Chest Pain and Shortness of Breath   HPI Marcus Santos is a 24 y.o. male with a past medical history of diabetes presents to the emergency department for intermittent left chest pain and now with a mild sore throat.  According to the patient for the past 5 or 6 months he has been experiencing chest discomfort at times in left chest.  Seems to be worse with certain movements or heavy lifting.  Patient states he does a lot of heavy lifting at his job.  Denies any shortness of breath nausea or diaphoresis.  Patient states yesterday began experiencing some discomfort in left ear now has mild discomfort in the left throat as well which feels like a pressure per patient.  Denies any fever.  No cough.  Very minimal left chest discomfort currently.   Past Medical History:  Diagnosis Date  . Diabetes mellitus without complication (HCC)     There are no problems to display for this patient.   History reviewed. No pertinent surgical history.  Prior to Admission medications   Not on File    Allergies  Allergen Reactions  . Citalopram   . Dayquil [Pseudoephedrine-Apap-Dm]   . Morphine And Related     PAtient states "makes me SOB and to have a stroke"  . Nyquil Multi-Symptom [Pseudoeph-Doxylamine-Dm-Apap]     History reviewed. No pertinent family history.  Social History Social History   Tobacco Use  . Smoking status: Current Every Day Smoker    Packs/day: 0.50    Types: E-cigarettes  . Smokeless tobacco: Former Engineer, water Use Topics  . Alcohol use: Yes  . Drug use: Yes    Types: Marijuana    Comment: opiods sometimes; CBD    Review of Systems Constitutional: Negative for fever. ENT: Positive for left ear discomfort and left throat discomfort.  Although states left ear discomfort is  nearly resolved. Cardiovascular: Intermittent left chest pain x5 months Respiratory: Negative for shortness of breath. Gastrointestinal: Negative for abdominal pain, vomiting and diarrhea. Musculoskeletal: Negative for musculoskeletal complaints Neurological: Negative for headache All other ROS negative  ____________________________________________   PHYSICAL EXAM:  VITAL SIGNS: ED Triage Vitals  Enc Vitals Group     BP 05/03/20 2046 120/80     Pulse Rate 05/03/20 2046 (!) 106     Resp 05/03/20 2046 16     Temp 05/03/20 2046 99.1 F (37.3 C)     Temp Source 05/03/20 2046 Oral     SpO2 05/03/20 2046 100 %     Weight 05/03/20 2048 164 lb 0.4 oz (74.4 kg)     Height 05/03/20 2048 5\' 7"  (1.702 m)     Head Circumference --      Peak Flow --      Pain Score 05/03/20 2047 10     Pain Loc --      Pain Edu? --      Excl. in GC? --     Constitutional: Alert and oriented. Well appearing and in no distress. Eyes: Normal exam ENT      Head: Normocephalic and atraumatic.  Normal tympanic membrane.      Mouth/Throat: Mucous membranes are moist.  No pharyngeal erythema, tonsillar hypertrophy exudate or uvular deviation.  No anterior cervical lymphadenopathy Cardiovascular: Normal rate, regular rhythm. No murmurs, rubs, or gallops. Respiratory:  Normal respiratory effort without tachypnea nor retractions. Breath sounds are clear.  Left chest wall is moderately tender to palpation. Gastrointestinal: Soft and nontender. No distention. Musculoskeletal: Nontender with normal range of motion in all extremities.  Neurologic:  Normal speech and language. No gross focal neurologic deficits are appreciated. Skin:  Skin is warm, dry and intact.  Psychiatric: Mood and affect are normal. Speech and behavior are normal.   ____________________________________________    EKG  EKG viewed and interpreted by myself shows a normal sinus rhythm at 93 bpm with a narrow QRS, normal axis, normal intervals,  no concerning ST changes.  ____________________________________________    RADIOLOGY  Chest x-ray is negative  ____________________________________________   INITIAL IMPRESSION / ASSESSMENT AND PLAN / ED COURSE  Pertinent labs & imaging results that were available during my care of the patient were reviewed by me and considered in my medical decision making (see chart for details).   Patient presents to the emergency department for left-sided chest pain intermittent x5 months as well as some mild soreness of his left throat.  Overall the patient appears extremely well.  Reassuring physical exam.  No signs of pharyngitis or tonsillitis on my exam.  Work-up is reassuring including a reassuring EKG troponin and chest x-ray.  Possibly early developing viral syndrome.  I recommended supportive care at home as well as my normal chest pain return precautions.  Given the negative troponin and reassuring EKG chest x-ray and reproducibility of the pain highly suspect musculoskeletal/chest wall discomfort.  Marcus Santos was evaluated in Emergency Department on 05/03/2020 for the symptoms described in the history of present illness. He was evaluated in the context of the global COVID-19 pandemic, which necessitated consideration that the patient might be at risk for infection with the SARS-CoV-2 virus that causes COVID-19. Institutional protocols and algorithms that pertain to the evaluation of patients at risk for COVID-19 are in a state of rapid change based on information released by regulatory bodies including the CDC and federal and state organizations. These policies and algorithms were followed during the patient's care in the ED.  ____________________________________________   FINAL CLINICAL IMPRESSION(S) / ED DIAGNOSES  Chest pain    Harvest Dark, MD 05/03/20 2134

## 2020-05-03 NOTE — ED Notes (Signed)
E-signature not working at this time. Pt verbalized understanding of D/C instructions, prescriptions and follow up care with no further questions at this time. Pt in NAD and ambulatory at time of D/C.  

## 2020-05-03 NOTE — ED Triage Notes (Signed)
Pt to ED reporting left sided chest pain and SOB for the past couple months. Pt reports when he moves a certain way the pain intensifies and chest is tender upon palpation. Pt also reports the SOB feels like he can not take a deep breath and like, "someone has their hand around my throat."   Pt reports today he has been getting lightheaded and dizzy and had a near syncopal episode while moving boxes at work. No known exposure to COVID. Nonproductive cough.

## 2021-01-10 IMAGING — CR DG CHEST 2V
2 series · 2 of 2 positions shown · non-contrast
Comparison: 10/11/2019

CLINICAL DATA: Left chest pain

EXAM:
CHEST - 2 VIEW

[chest pa]
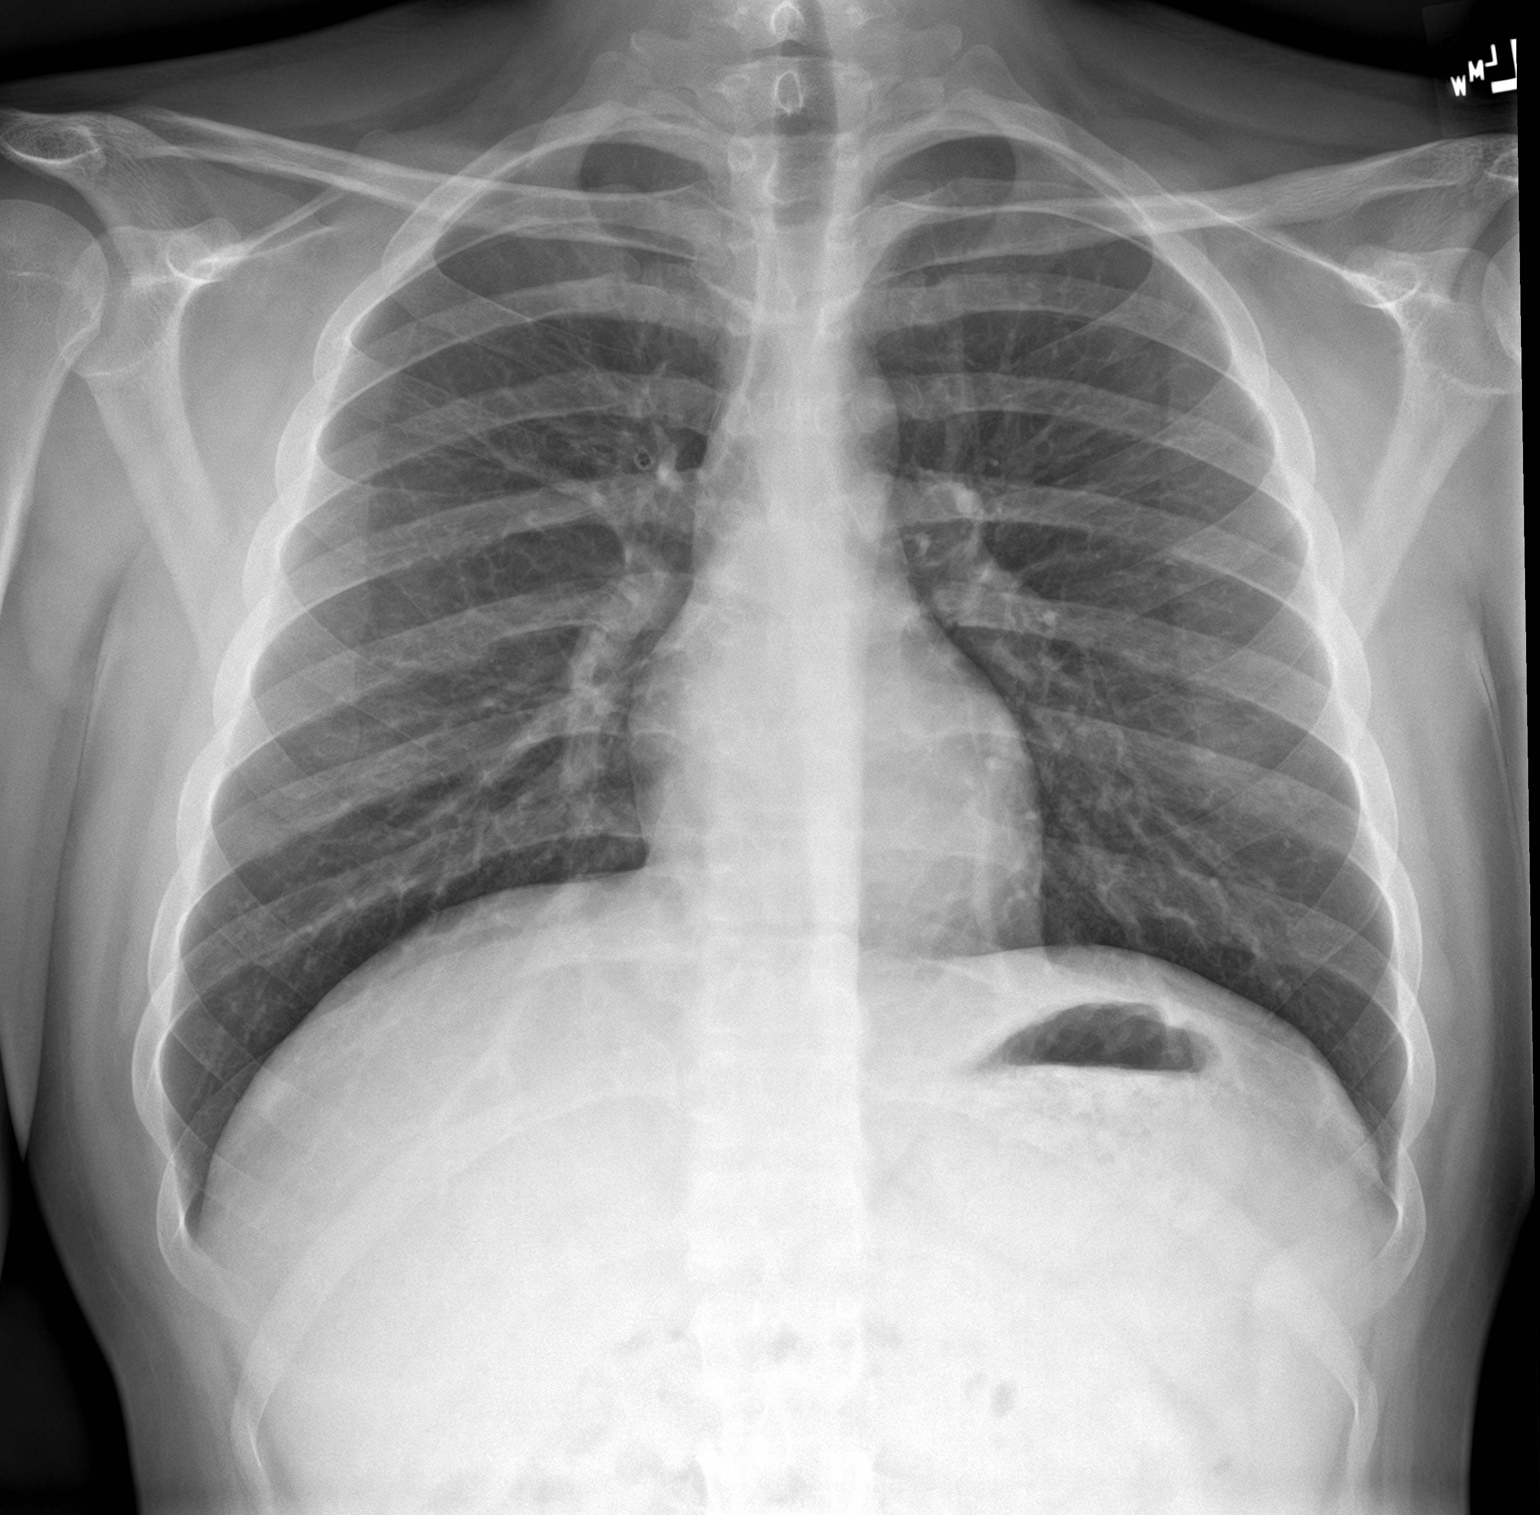

[chest lat]
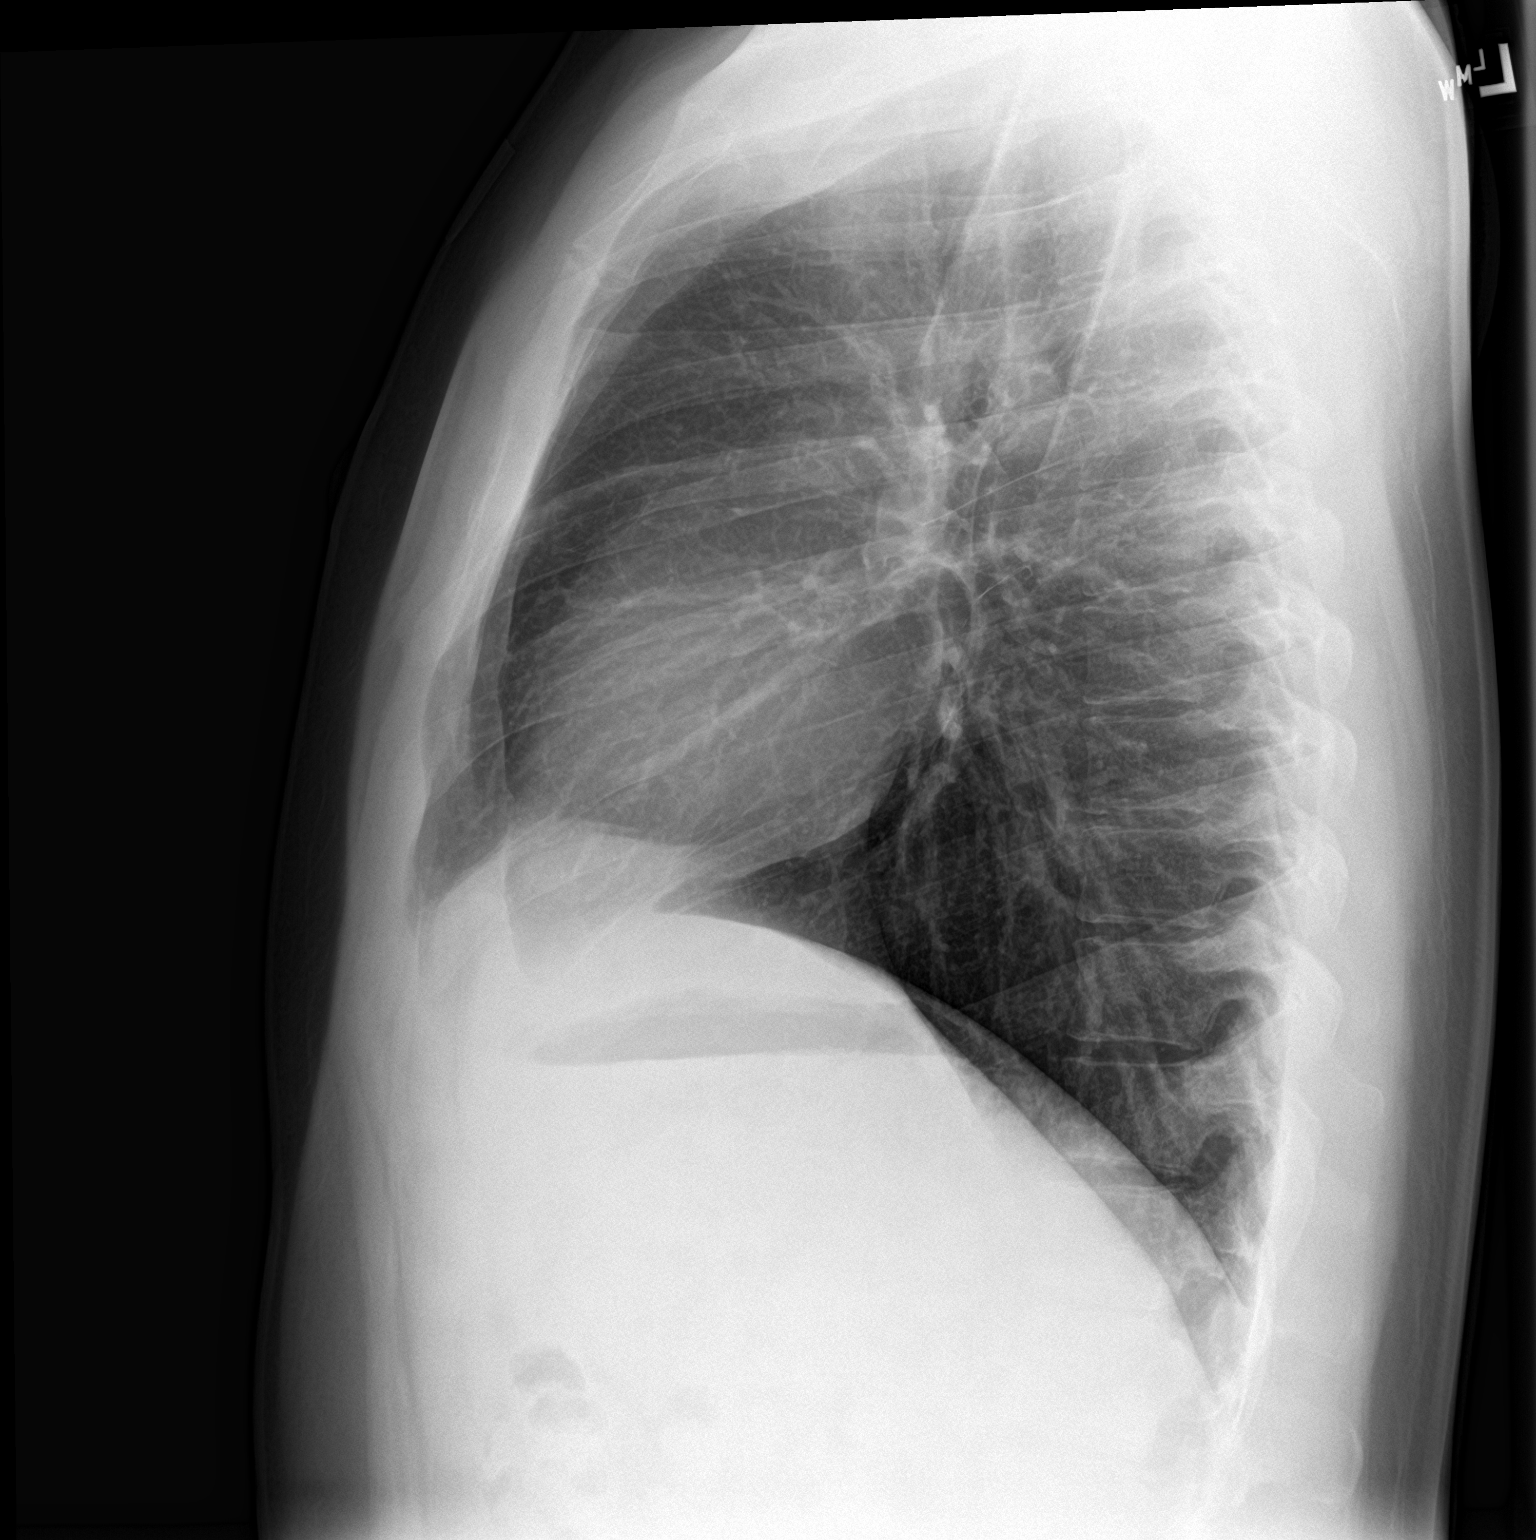

[2 of 2 positions shown; findings below may reference images not displayed]

FINDINGS: The heart size and mediastinal contours are within normal limits.
Both lungs are clear. The visualized skeletal structures are
unremarkable.
IMPRESSION: Normal study.

## 2023-11-05 ENCOUNTER — Ambulatory Visit: Payer: Medicaid Other | Admitting: Psychiatry

## 2024-04-06 ENCOUNTER — Emergency Department

## 2024-04-06 ENCOUNTER — Emergency Department
Admission: EM | Admit: 2024-04-06 | Discharge: 2024-04-06 | Disposition: A | Discharge status: Home / Self Care | Attending: Emergency Medicine | Admitting: Emergency Medicine

## 2024-04-06 ENCOUNTER — Other Ambulatory Visit: Payer: Self-pay

## 2024-04-06 DIAGNOSIS — K0889 Other specified disorders of teeth and supporting structures: Secondary | ICD-10-CM | POA: Diagnosis present

## 2024-04-06 DIAGNOSIS — D72829 Elevated white blood cell count, unspecified: Secondary | ICD-10-CM | POA: Insufficient documentation

## 2024-04-06 DIAGNOSIS — K047 Periapical abscess without sinus: Secondary | ICD-10-CM | POA: Diagnosis not present

## 2024-04-06 DIAGNOSIS — K029 Dental caries, unspecified: Secondary | ICD-10-CM | POA: Diagnosis not present

## 2024-04-06 LAB — BASIC METABOLIC PANEL WITH GFR
Anion gap: 16 — ABNORMAL HIGH (ref 5–15)
BUN: 10 mg/dL (ref 6–20)
CO2: 22 mmol/L (ref 22–32)
Calcium: 9.3 mg/dL (ref 8.9–10.3)
Chloride: 94 mmol/L — ABNORMAL LOW (ref 98–111)
Creatinine, Ser: 0.93 mg/dL (ref 0.61–1.24)
GFR, Estimated: >60 mL/min (ref >60)
Glucose, Bld: 456 mg/dL — ABNORMAL HIGH (ref 70–99)
Potassium: 3.7 mmol/L (ref 3.5–5.1)
Sodium: 132 mmol/L — ABNORMAL LOW (ref 135–145)

## 2024-04-06 LAB — CBC
HCT: 43.5 % (ref 39.0–52.0)
Hemoglobin: 15.4 g/dL (ref 13.0–17.0)
MCH: 30.1 pg (ref 26.0–34.0)
MCHC: 35.4 g/dL (ref 30.0–36.0)
MCV: 85.1 fL (ref 80.0–100.0)
Platelets: 385 10*3/uL (ref 150–400)
RBC: 5.11 MIL/uL (ref 4.22–5.81)
RDW: 11.4 % — ABNORMAL LOW (ref 11.5–15.5)
WBC: 11.2 10*3/uL — ABNORMAL HIGH (ref 4.0–10.5)
nRBC: 0 % (ref 0.0–0.2)

## 2024-04-06 MED ORDER — TRAMADOL HCL 50 MG PO TABS
50.0000 mg | ORAL_TABLET | Freq: Once | ORAL | Status: AC
  Administered 2024-04-06: 50 mg via ORAL
  Filled 2024-04-06: qty 1

## 2024-04-06 MED ORDER — AMOXICILLIN-POT CLAVULANATE 875-125 MG PO TABS
1.0000 | ORAL_TABLET | Freq: Once | ORAL | Status: AC
  Administered 2024-04-06: 1 via ORAL
  Filled 2024-04-06: qty 1

## 2024-04-06 MED ORDER — IBUPROFEN 600 MG PO TABS: 600.0000 mg | ORAL_TABLET | Freq: Four times a day (QID) | ORAL | 0 refills | Status: DC | PRN

## 2024-04-06 MED ORDER — IOHEXOL 300 MG/ML  SOLN
75.0000 mL | Freq: Once | INTRAMUSCULAR | Status: AC | PRN
  Administered 2024-04-06: 75 mL via INTRAVENOUS

## 2024-04-06 MED ORDER — AMOXICILLIN-POT CLAVULANATE 875-125 MG PO TABS: 1.0000 | ORAL_TABLET | Freq: Two times a day (BID) | ORAL | 0 refills | Status: AC

## 2024-04-06 MED ORDER — TRAMADOL HCL 50 MG PO TABS: 50.0000 mg | ORAL_TABLET | Freq: Four times a day (QID) | ORAL | 0 refills | Status: DC | PRN

## 2024-04-06 NOTE — ED Provider Notes (Addendum)
 [place] EMERGENCY DEPARTMENT AT [facility] Provider Note   CSN: [id number] Arrival date & time: 04/06/24  1324     History  Chief Complaint  Patient presents with   Dental Pain    Marcus Santos is a 28 y.o. male.  Presents to the emergency department for evaluation of dental pain.  Patient has a history of dental infections.  States he has a pus pocket in his upper palate.  Has been seeing the dentist but has not followed up recently for dental excision.  Denies any fevers, trismus.  Has not been taking anything except for over-the-counter medication for his pain.  Has not been on any recent antibiotics.  Denies any fevers chills.  HPI     Home Medications Prior to Admission medications   Medication Sig Start Date End Date Taking? Authorizing Provider  amoxicillin -clavulanate (AUGMENTIN ) 875-125 MG tablet Take 1 tablet by mouth 2 (two) times daily for 10 days. 04/06/24 04/16/24 Yes Coralyn Derry, PA-C  ibuprofen (ADVIL) 600 MG tablet Take 1 tablet (600 mg total) by mouth every 6 (six) hours as needed for moderate pain (pain score 4-6). 04/06/24  Yes Coralyn Derry, PA-C  traMADol (ULTRAM) 50 MG tablet Take 1 tablet (50 mg total) by mouth every 6 (six) hours as needed. 04/06/24  Yes Coralyn Derry, PA-C      Allergies    Citalopram, Dayquil [pseudoephedrine-apap-dm], Morphine and codeine, and Nyquil multi-symptom [pseudoeph-doxylamine-dm-apap]    Review of Systems   Review of Systems  Physical Exam Updated Vital Signs BP (!) 137/102   Pulse (!) 116   Temp 98 F (36.7 C) (Oral)   Resp 16   Ht 5\' 7"  (1.702 m)   Wt 74.4 kg   SpO2 97%   BMI 25.69 kg/m  Physical Exam Constitutional:      Appearance: He is well-developed.  HENT:     Head: Normocephalic and atraumatic.     Mouth/Throat:     Mouth: Mucous membranes are moist.     Pharynx: No oropharyngeal exudate or posterior oropharyngeal erythema.     Comments: Diffuse dental decay.  Upper palate shows  a fluctuant 2 x 2 cm abscess, upper palate.  A 22-gauge needle was inserted into the fluctuant tissue and aspirated 4 cc of pus.  Complete decompression of abscess was performed with improved pain.  No other abscess noted.  No drainage noted.  No trismus.  Able to eat and drink well. Eyes:     Conjunctiva/sclera: Conjunctivae normal.  Cardiovascular:     Rate and Rhythm: Normal rate.  Pulmonary:     Effort: Pulmonary effort is normal. No respiratory distress.  Musculoskeletal:        General: Normal range of motion.     Cervical back: Normal range of motion and neck supple. No rigidity.  Skin:    General: Skin is warm.     Findings: No rash.  Neurological:     General: No focal deficit present.     Mental Status: He is alert and oriented to person, place, and time. Mental status is at baseline.  Psychiatric:        Behavior: Behavior normal.        Thought Content: Thought content normal.     ED Results / Procedures / Treatments   Labs (all labs ordered are listed, but only abnormal results are displayed) Labs Reviewed  CBC - Abnormal; Notable for the following components:      Result Value  WBC 11.2 (*)    RDW 11.4 (*)    All other components within normal limits  BASIC METABOLIC PANEL WITH GFR - Abnormal; Notable for the following components:   Sodium 132 (*)    Chloride 94 (*)    Glucose, Bld 456 (*)    Anion gap 16 (*)    All other components within normal limits    EKG None  Radiology CT Maxillofacial W Contrast Result Date: 04/06/2024 CLINICAL DATA:  Dental pain EXAM: CT MAXILLOFACIAL WITH CONTRAST TECHNIQUE: Multidetector CT imaging of the maxillofacial structures was performed with intravenous contrast. Multiplanar CT image reconstructions were also generated. RADIATION DOSE REDUCTION: This exam was performed according to the departmental dose-optimization program which includes automated exposure control, adjustment of the mA and/or kV according to patient size  and/or use of iterative reconstruction technique. CONTRAST:  75mL OMNIPAQUE IOHEXOL 300 MG/ML  SOLN COMPARISON:  None Available. FINDINGS: Osseous: No fracture or mandibular dislocation. No destructive process. Orbits: Negative. No traumatic or inflammatory finding. Sinuses: Inferior right maxillary sinus mucosal thickening. Soft tissues: Negative. Limited intracranial: No significant or unexpected finding. Other: Many dental caries and periapical lucencies. IMPRESSION: 1. No visible edema or discrete drainable fluid collection. 2. Many dental caries and periapical lucencies. Inferior right maxillary sinus mucosal thickening may odontogenic. Electronically Signed   By: Stevenson Elbe M.D.   On: 04/06/2024 20:52    Procedures .Incision and Drainage  Date/Time: 04/06/2024 9:35 PM  Performed by: Coralyn Derry, PA-C Authorized by: Coralyn Derry, PA-C   Consent:    Consent obtained:  Verbal   Consent given by:  Patient   Risks discussed:  Bleeding and incomplete drainage Universal protocol:    Patient identity confirmed:  Verbally with patient Location:    Type:  Abscess   Location:  Mouth   Mouth location:  Palate Procedure type:    Complexity:  Simple Procedure details:    Needle aspiration: yes     Needle size:  22 G   Drainage:  Purulent Post-procedure details:    Procedure completion:  Tolerated Comments:     4 cc of pus aspirated     Medications Ordered in ED Medications  iohexol (OMNIPAQUE) 300 MG/ML solution 75 mL (75 mLs Intravenous Contrast Given 04/06/24 2010)  amoxicillin -clavulanate (AUGMENTIN ) 875-125 MG per tablet 1 tablet (1 tablet Oral Given 04/06/24 2114)  traMADol (ULTRAM) tablet 50 mg (50 mg Oral Given 04/06/24 2115)    ED Course/ Medical Decision Making/ A&P                                 Medical Decision Making Risk Prescription drug management.   28 year old male with slightly elevated white blood cell count.  Afebrile here in the emergency  department.  He has had years of dental decay with intermittent infections.  Has recently developed increased dental pain with abscess along the upper palate.  Patient agreed and assented to aspiration of the upper palate abscess.  This was performed using a 22-gauge needle.  Patient tolerated the procedure well.  He is placed on Augmentin , tramadol and ibuprofen.  He is encouraged to follow-up with his dentist, states he anticipates following up with his dentist soon as he was supposed to follow-up recently but did not.  He is given strict return precautions understands signs symptoms return to ED for. Final Clinical Impression(s) / ED Diagnoses Final diagnoses:  Pain, dental  Dental caries  Dental infection  Dental abscess    Rx / DC Orders ED Discharge Orders          Ordered    amoxicillin -clavulanate (AUGMENTIN ) 875-125 MG tablet  2 times daily        04/06/24 2122    traMADol (ULTRAM) 50 MG tablet  Every 6 hours PRN        04/06/24 2122    ibuprofen (ADVIL) 600 MG tablet  Every 6 hours PRN        04/06/24 2122              Coralyn Derry, PA-C 04/06/24 2129    Coralyn Derry, PA-C 04/06/24 2135    Shane Darling, MD 04/06/24 2242

## 2024-04-06 NOTE — Discharge Instructions (Signed)
 Please take Augmentin  twice daily as prescribed.  You may take ibuprofen 600 mg every 6 hours with food as needed for pain.  You may also take 1000 mg of Tylenol  every 6 hours as needed for pain.  Use tramadol every 6 hours as needed for moderate to severe pain.  Call dental clinic to schedule follow-up appointment.

## 2024-04-06 NOTE — ED Triage Notes (Signed)
 Pt presents via POV c/o right upper dental pain x3 days. Reports has "puss pocket" in upper mouth.

## 2024-04-08 ENCOUNTER — Emergency Department
Admission: EM | Admit: 2024-04-08 | Discharge: 2024-04-08 | Discharge status: Against Medical Advice | Attending: Emergency Medicine | Admitting: Emergency Medicine

## 2024-04-08 ENCOUNTER — Other Ambulatory Visit: Payer: Self-pay

## 2024-04-08 ENCOUNTER — Encounter: Payer: Self-pay | Admitting: *Deleted

## 2024-04-08 DIAGNOSIS — Z5321 Procedure and treatment not carried out due to patient leaving prior to being seen by health care provider: Secondary | ICD-10-CM | POA: Insufficient documentation

## 2024-04-08 DIAGNOSIS — K047 Periapical abscess without sinus: Secondary | ICD-10-CM | POA: Diagnosis not present

## 2024-04-08 DIAGNOSIS — R109 Unspecified abdominal pain: Secondary | ICD-10-CM | POA: Insufficient documentation

## 2024-04-08 DIAGNOSIS — R1110 Vomiting, unspecified: Secondary | ICD-10-CM | POA: Diagnosis not present

## 2024-04-08 DIAGNOSIS — E1165 Type 2 diabetes mellitus with hyperglycemia: Secondary | ICD-10-CM | POA: Insufficient documentation

## 2024-04-08 LAB — COMPREHENSIVE METABOLIC PANEL WITH GFR
ALT: 20 U/L (ref 0–44)
AST: 16 U/L (ref 15–41)
Albumin: 4.5 g/dL (ref 3.5–5.0)
Alkaline Phosphatase: 99 U/L (ref 38–126)
Anion gap: 27 — ABNORMAL HIGH (ref 5–15)
BUN: 23 mg/dL — ABNORMAL HIGH (ref 6–20)
CO2: 11 mmol/L — ABNORMAL LOW (ref 22–32)
Calcium: 9.7 mg/dL (ref 8.9–10.3)
Chloride: 92 mmol/L — ABNORMAL LOW (ref 98–111)
Creatinine, Ser: 1.1 mg/dL (ref 0.61–1.24)
GFR, Estimated: >60 mL/min (ref >60)
Glucose, Bld: 326 mg/dL — ABNORMAL HIGH (ref 70–99)
Potassium: 3.8 mmol/L (ref 3.5–5.1)
Sodium: 130 mmol/L — ABNORMAL LOW (ref 135–145)
Total Bilirubin: 1.9 mg/dL — ABNORMAL HIGH (ref 0.0–1.2)
Total Protein: 8.9 g/dL — ABNORMAL HIGH (ref 6.5–8.1)

## 2024-04-08 LAB — CBC
HCT: 46.2 % (ref 39.0–52.0)
Hemoglobin: 16.3 g/dL (ref 13.0–17.0)
MCH: 29.9 pg (ref 26.0–34.0)
MCHC: 35.3 g/dL (ref 30.0–36.0)
MCV: 84.8 fL (ref 80.0–100.0)
Platelets: 530 10*3/uL — ABNORMAL HIGH (ref 150–400)
RBC: 5.45 MIL/uL (ref 4.22–5.81)
RDW: 11.5 % (ref 11.5–15.5)
WBC: 12.5 10*3/uL — ABNORMAL HIGH (ref 4.0–10.5)
nRBC: 0 % (ref 0.0–0.2)

## 2024-04-08 LAB — CBG MONITORING, ED: Glucose-Capillary: 302 mg/dL — ABNORMAL HIGH (ref 70–99)

## 2024-04-08 LAB — LIPASE, BLOOD: Lipase: 24 U/L (ref 11–51)

## 2024-04-08 NOTE — ED Triage Notes (Addendum)
 Pt to triage via wheelchair.  Pt brought in via ems from home  Pt had recent dental abscess.  Took abx and tramadol, pt was seen here Monday night.  Pt continues to have vomiting.  Pt is diabetic.   Pt alert.  Pt smells tutty fruitty in triage. Fsbs 302

## 2024-04-09 ENCOUNTER — Emergency Department

## 2024-04-09 ENCOUNTER — Observation Stay
Admission: EM | Admit: 2024-04-09 | Discharge: 2024-04-10 | Disposition: A | Discharge status: Home / Self Care | Attending: Internal Medicine | Admitting: Internal Medicine

## 2024-04-09 ENCOUNTER — Other Ambulatory Visit: Payer: Self-pay

## 2024-04-09 DIAGNOSIS — K2970 Gastritis, unspecified, without bleeding: Secondary | ICD-10-CM

## 2024-04-09 DIAGNOSIS — R112 Nausea with vomiting, unspecified: Secondary | ICD-10-CM | POA: Diagnosis present

## 2024-04-09 DIAGNOSIS — K209 Esophagitis, unspecified without bleeding: Secondary | ICD-10-CM

## 2024-04-09 DIAGNOSIS — J9601 Acute respiratory failure with hypoxia: Principal | ICD-10-CM | POA: Diagnosis present

## 2024-04-09 DIAGNOSIS — E860 Dehydration: Secondary | ICD-10-CM

## 2024-04-09 DIAGNOSIS — K920 Hematemesis: Secondary | ICD-10-CM

## 2024-04-09 DIAGNOSIS — A419 Sepsis, unspecified organism: Secondary | ICD-10-CM

## 2024-04-09 DIAGNOSIS — E1110 Type 2 diabetes mellitus with ketoacidosis without coma: Secondary | ICD-10-CM | POA: Diagnosis not present

## 2024-04-09 DIAGNOSIS — E1310 Other specified diabetes mellitus with ketoacidosis without coma: Secondary | ICD-10-CM | POA: Diagnosis not present

## 2024-04-09 DIAGNOSIS — Z1152 Encounter for screening for COVID-19: Secondary | ICD-10-CM | POA: Diagnosis not present

## 2024-04-09 DIAGNOSIS — E872 Acidosis, unspecified: Secondary | ICD-10-CM | POA: Insufficient documentation

## 2024-04-09 DIAGNOSIS — Z87891 Personal history of nicotine dependence: Secondary | ICD-10-CM | POA: Insufficient documentation

## 2024-04-09 DIAGNOSIS — Z79899 Other long term (current) drug therapy: Secondary | ICD-10-CM | POA: Diagnosis not present

## 2024-04-09 DIAGNOSIS — R6510 Systemic inflammatory response syndrome (SIRS) of non-infectious origin without acute organ dysfunction: Secondary | ICD-10-CM

## 2024-04-09 DIAGNOSIS — Z91148 Patient's other noncompliance with medication regimen for other reason: Secondary | ICD-10-CM

## 2024-04-09 DIAGNOSIS — R109 Unspecified abdominal pain: Secondary | ICD-10-CM

## 2024-04-09 DIAGNOSIS — R9431 Abnormal electrocardiogram [ECG] [EKG]: Secondary | ICD-10-CM

## 2024-04-09 LAB — CBG MONITORING, ED
Glucose-Capillary: 464 mg/dL — ABNORMAL HIGH (ref 70–99)
Glucose-Capillary: 473 mg/dL — ABNORMAL HIGH (ref 70–99)
Glucose-Capillary: 393 mg/dL — ABNORMAL HIGH (ref 70–99)
Glucose-Capillary: 453 mg/dL — ABNORMAL HIGH (ref 70–99)
Glucose-Capillary: 312 mg/dL — ABNORMAL HIGH (ref 70–99)
Glucose-Capillary: 374 mg/dL — ABNORMAL HIGH (ref 70–99)
Glucose-Capillary: 199 mg/dL — ABNORMAL HIGH (ref 70–99)
Glucose-Capillary: 254 mg/dL — ABNORMAL HIGH (ref 70–99)
Glucose-Capillary: 210 mg/dL — ABNORMAL HIGH (ref 70–99)
Glucose-Capillary: 206 mg/dL — ABNORMAL HIGH (ref 70–99)
Glucose-Capillary: 149 mg/dL — ABNORMAL HIGH (ref 70–99)
Glucose-Capillary: 166 mg/dL — ABNORMAL HIGH (ref 70–99)

## 2024-04-09 LAB — BLOOD GAS, VENOUS
Acid-base deficit: 21.7 mmol/L — ABNORMAL HIGH (ref 0.0–2.0)
Acid-base deficit: 6.2 mmol/L — ABNORMAL HIGH (ref 0.0–2.0)
Bicarbonate: 7 mmol/L — ABNORMAL LOW (ref 20.0–28.0)
Bicarbonate: 17.7 mmol/L — ABNORMAL LOW (ref 20.0–28.0)
O2 Saturation: 74.1 %
O2 Saturation: 91.4 %
Patient temperature: 37
Patient temperature: 37
pCO2, Ven: 24 mmHg — ABNORMAL LOW (ref 44–60)
pCO2, Ven: 30 mmHg — ABNORMAL LOW (ref 44–60)
pH, Ven: 7.07 — CL (ref 7.25–7.43)
pH, Ven: 7.38 (ref 7.25–7.43)
pO2, Ven: 49 mmHg — ABNORMAL HIGH (ref 32–45)
pO2, Ven: 56 mmHg — ABNORMAL HIGH (ref 32–45)

## 2024-04-09 LAB — BASIC METABOLIC PANEL WITH GFR
Anion gap: 26 — ABNORMAL HIGH (ref 5–15)
Anion gap: 22 — ABNORMAL HIGH (ref 5–15)
Anion gap: 14 (ref 5–15)
Anion gap: 16 — ABNORMAL HIGH (ref 5–15)
Anion gap: 13 (ref 5–15)
BUN: 33 mg/dL — ABNORMAL HIGH (ref 6–20)
BUN: 32 mg/dL — ABNORMAL HIGH (ref 6–20)
BUN: 27 mg/dL — ABNORMAL HIGH (ref 6–20)
BUN: 26 mg/dL — ABNORMAL HIGH (ref 6–20)
BUN: 25 mg/dL — ABNORMAL HIGH (ref 6–20)
CO2: 10 mmol/L — ABNORMAL LOW (ref 22–32)
CO2: 12 mmol/L — ABNORMAL LOW (ref 22–32)
CO2: 16 mmol/L — ABNORMAL LOW (ref 22–32)
CO2: 17 mmol/L — ABNORMAL LOW (ref 22–32)
CO2: 18 mmol/L — ABNORMAL LOW (ref 22–32)
Calcium: 9.1 mg/dL (ref 8.9–10.3)
Calcium: 9.4 mg/dL (ref 8.9–10.3)
Calcium: 9.1 mg/dL (ref 8.9–10.3)
Calcium: 9.3 mg/dL (ref 8.9–10.3)
Calcium: 9.1 mg/dL (ref 8.9–10.3)
Chloride: 101 mmol/L (ref 98–111)
Chloride: 105 mmol/L (ref 98–111)
Chloride: 110 mmol/L (ref 98–111)
Chloride: 108 mmol/L (ref 98–111)
Chloride: 108 mmol/L (ref 98–111)
Creatinine, Ser: 1.44 mg/dL — ABNORMAL HIGH (ref 0.61–1.24)
Creatinine, Ser: 1.33 mg/dL — ABNORMAL HIGH (ref 0.61–1.24)
Creatinine, Ser: 0.89 mg/dL (ref 0.61–1.24)
Creatinine, Ser: 0.9 mg/dL (ref 0.61–1.24)
Creatinine, Ser: 0.9 mg/dL (ref 0.61–1.24)
GFR, Estimated: >60 mL/min (ref >60)
GFR, Estimated: >60 mL/min (ref >60)
GFR, Estimated: >60 mL/min (ref >60)
GFR, Estimated: >60 mL/min (ref >60)
GFR, Estimated: >60 mL/min (ref >60)
Glucose, Bld: 380 mg/dL — ABNORMAL HIGH (ref 70–99)
Glucose, Bld: 246 mg/dL — ABNORMAL HIGH (ref 70–99)
Glucose, Bld: 168 mg/dL — ABNORMAL HIGH (ref 70–99)
Glucose, Bld: 143 mg/dL — ABNORMAL HIGH (ref 70–99)
Glucose, Bld: 122 mg/dL — ABNORMAL HIGH (ref 70–99)
Potassium: 3.1 mmol/L — ABNORMAL LOW (ref 3.5–5.1)
Potassium: 3.3 mmol/L — ABNORMAL LOW (ref 3.5–5.1)
Potassium: 3.2 mmol/L — ABNORMAL LOW (ref 3.5–5.1)
Potassium: 3.2 mmol/L — ABNORMAL LOW (ref 3.5–5.1)
Potassium: 3 mmol/L — ABNORMAL LOW (ref 3.5–5.1)
Sodium: 137 mmol/L (ref 135–145)
Sodium: 139 mmol/L (ref 135–145)
Sodium: 140 mmol/L (ref 135–145)
Sodium: 141 mmol/L (ref 135–145)
Sodium: 139 mmol/L (ref 135–145)

## 2024-04-09 LAB — COMPREHENSIVE METABOLIC PANEL WITH GFR
ALT: 19 U/L (ref 0–44)
AST: 18 U/L (ref 15–41)
Albumin: 4.3 g/dL (ref 3.5–5.0)
Alkaline Phosphatase: 111 U/L (ref 38–126)
Anion gap: 31 — ABNORMAL HIGH (ref 5–15)
BUN: 35 mg/dL — ABNORMAL HIGH (ref 6–20)
CO2: 9 mmol/L — ABNORMAL LOW (ref 22–32)
Calcium: 9.2 mg/dL (ref 8.9–10.3)
Chloride: 93 mmol/L — ABNORMAL LOW (ref 98–111)
Creatinine, Ser: 1.45 mg/dL — ABNORMAL HIGH (ref 0.61–1.24)
GFR, Estimated: >60 mL/min (ref >60)
Glucose, Bld: 491 mg/dL — ABNORMAL HIGH (ref 70–99)
Potassium: 3.4 mmol/L — ABNORMAL LOW (ref 3.5–5.1)
Sodium: 133 mmol/L — ABNORMAL LOW (ref 135–145)
Total Bilirubin: 2.1 mg/dL — ABNORMAL HIGH (ref 0.0–1.2)
Total Protein: 8.4 g/dL — ABNORMAL HIGH (ref 6.5–8.1)

## 2024-04-09 LAB — TROPONIN I (HIGH SENSITIVITY)
Troponin I (High Sensitivity): 6 ng/L (ref <18)
Troponin I (High Sensitivity): 7 ng/L (ref <18)

## 2024-04-09 LAB — HEMOGLOBIN AND HEMATOCRIT, BLOOD
HCT: 35.7 % — ABNORMAL LOW (ref 39.0–52.0)
Hemoglobin: 13 g/dL (ref 13.0–17.0)

## 2024-04-09 LAB — TYPE AND SCREEN
ABO/RH(D): O POS
Antibody Screen: NEGATIVE

## 2024-04-09 LAB — URINALYSIS, ROUTINE W REFLEX MICROSCOPIC
Bacteria, UA: NONE SEEN
Bilirubin Urine: NEGATIVE
Glucose, UA: >=500 mg/dL — AB
Ketones, ur: 80 mg/dL — AB
Leukocytes,Ua: NEGATIVE
Nitrite: NEGATIVE
Protein, ur: 30 mg/dL — AB
Specific Gravity, Urine: 1.018 (ref 1.005–1.030)
WBC, UA: 0 WBC/hpf (ref 0–5)
pH: 5 (ref 5.0–8.0)

## 2024-04-09 LAB — PROTIME-INR
INR: 1.2 (ref 0.8–1.2)
Prothrombin Time: 15.5 s — ABNORMAL HIGH (ref 11.4–15.2)

## 2024-04-09 LAB — CBC
HCT: 48 % (ref 39.0–52.0)
Hemoglobin: 16.7 g/dL (ref 13.0–17.0)
MCH: 30.3 pg (ref 26.0–34.0)
MCHC: 34.8 g/dL (ref 30.0–36.0)
MCV: 87.1 fL (ref 80.0–100.0)
Platelets: 627 10*3/uL — ABNORMAL HIGH (ref 150–400)
RBC: 5.51 MIL/uL (ref 4.22–5.81)
RDW: 11.5 % (ref 11.5–15.5)
WBC: 27.6 10*3/uL — ABNORMAL HIGH (ref 4.0–10.5)
nRBC: 0 % (ref 0.0–0.2)

## 2024-04-09 LAB — BETA-HYDROXYBUTYRIC ACID
Beta-Hydroxybutyric Acid: >8 mmol/L — ABNORMAL HIGH (ref 0.05–0.27)
Beta-Hydroxybutyric Acid: >8 mmol/L — ABNORMAL HIGH (ref 0.05–0.27)
Beta-Hydroxybutyric Acid: 6.85 mmol/L — ABNORMAL HIGH (ref 0.05–0.27)
Beta-Hydroxybutyric Acid: 3.02 mmol/L — ABNORMAL HIGH (ref 0.05–0.27)
Beta-Hydroxybutyric Acid: 2.7 mmol/L — ABNORMAL HIGH (ref 0.05–0.27)
Beta-Hydroxybutyric Acid: 3.12 mmol/L — ABNORMAL HIGH (ref 0.05–0.27)

## 2024-04-09 LAB — RESP PANEL BY RT-PCR (RSV, FLU A&B, COVID)  RVPGX2
Influenza A by PCR: NEGATIVE
Influenza B by PCR: NEGATIVE
Resp Syncytial Virus by PCR: NEGATIVE
SARS Coronavirus 2 by RT PCR: NEGATIVE

## 2024-04-09 LAB — GLUCOSE, CAPILLARY
Glucose-Capillary: 179 mg/dL — ABNORMAL HIGH (ref 70–99)
Glucose-Capillary: 145 mg/dL — ABNORMAL HIGH (ref 70–99)
Glucose-Capillary: 155 mg/dL — ABNORMAL HIGH (ref 70–99)
Glucose-Capillary: 146 mg/dL — ABNORMAL HIGH (ref 70–99)
Glucose-Capillary: 131 mg/dL — ABNORMAL HIGH (ref 70–99)
Glucose-Capillary: 150 mg/dL — ABNORMAL HIGH (ref 70–99)

## 2024-04-09 LAB — HIV ANTIBODY (ROUTINE TESTING W REFLEX): HIV Screen 4th Generation wRfx: NONREACTIVE

## 2024-04-09 LAB — MAGNESIUM: Magnesium: 2 mg/dL (ref 1.7–2.4)

## 2024-04-09 LAB — LACTIC ACID, PLASMA
Lactic Acid, Venous: 5.3 mmol/L (ref 0.5–1.9)
Lactic Acid, Venous: 3.2 mmol/L (ref 0.5–1.9)
Lactic Acid, Venous: 3.5 mmol/L (ref 0.5–1.9)
Lactic Acid, Venous: 1.4 mmol/L (ref 0.5–1.9)

## 2024-04-09 LAB — MRSA NEXT GEN BY PCR, NASAL: MRSA by PCR Next Gen: NOT DETECTED

## 2024-04-09 LAB — LIPASE, BLOOD: Lipase: 20 U/L (ref 11–51)

## 2024-04-09 MED ORDER — LACTATED RINGERS IV BOLUS
1000.0000 mL | Freq: Once | INTRAVENOUS | Status: AC
  Administered 2024-04-09: 1000 mL via INTRAVENOUS

## 2024-04-09 MED ORDER — LACTATED RINGERS IV SOLN: INTRAVENOUS | Status: AC

## 2024-04-09 MED ORDER — PROMETHAZINE (PHENERGAN) 6.25MG IN NS 50ML IVPB
6.2500 mg | Freq: Once | INTRAVENOUS | Status: DC
  Filled 2024-04-09: qty 50

## 2024-04-09 MED ORDER — INSULIN REGULAR(HUMAN) IN NACL 100-0.9 UT/100ML-% IV SOLN
INTRAVENOUS | Status: DC
  Administered 2024-04-09: 8 [IU]/h via INTRAVENOUS
  Filled 2024-04-09: qty 100

## 2024-04-09 MED ORDER — IOHEXOL 350 MG/ML SOLN
100.0000 mL | Freq: Once | INTRAVENOUS | Status: AC | PRN
  Administered 2024-04-09: 100 mL via INTRAVENOUS

## 2024-04-09 MED ORDER — SODIUM CHLORIDE 0.9 % IV SOLN: 12.5000 mg | Freq: Four times a day (QID) | INTRAVENOUS | Status: DC | PRN

## 2024-04-09 MED ORDER — CHLORHEXIDINE GLUCONATE CLOTH 2 % EX PADS
6.0000 | MEDICATED_PAD | Freq: Every day | CUTANEOUS | Status: DC
  Administered 2024-04-09 – 2024-04-10 (×2): 6 via TOPICAL

## 2024-04-09 MED ORDER — HYDROMORPHONE HCL 1 MG/ML IJ SOLN
0.5000 mg | Freq: Once | INTRAMUSCULAR | Status: AC
  Administered 2024-04-09: 0.5 mg via INTRAVENOUS
  Filled 2024-04-09: qty 0.5

## 2024-04-09 MED ORDER — POTASSIUM CHLORIDE 10 MEQ/100ML IV SOLN
10.0000 meq | INTRAVENOUS | Status: AC
  Administered 2024-04-10 (×4): 10 meq via INTRAVENOUS
  Filled 2024-04-09 (×4): qty 100

## 2024-04-09 MED ORDER — PANTOPRAZOLE SODIUM 40 MG IV SOLR
40.0000 mg | Freq: Two times a day (BID) | INTRAVENOUS | Status: DC
  Administered 2024-04-09 (×2): 40 mg via INTRAVENOUS
  Filled 2024-04-09 (×2): qty 10

## 2024-04-09 MED ORDER — PANTOPRAZOLE SODIUM 40 MG IV SOLR
80.0000 mg | Freq: Once | INTRAVENOUS | Status: AC
  Administered 2024-04-09: 80 mg via INTRAVENOUS
  Filled 2024-04-09: qty 20

## 2024-04-09 MED ORDER — LACTATED RINGERS IV BOLUS (SEPSIS)
1000.0000 mL | Freq: Once | INTRAVENOUS | Status: AC
  Administered 2024-04-09: 1000 mL via INTRAVENOUS

## 2024-04-09 MED ORDER — DEXTROSE 50 % IV SOLN
0.0000 mL | INTRAVENOUS | Status: DC | PRN
  Filled 2024-04-09: qty 50

## 2024-04-09 MED ORDER — VANCOMYCIN HCL IN DEXTROSE 1-5 GM/200ML-% IV SOLN
1000.0000 mg | Freq: Once | INTRAVENOUS | Status: AC
  Administered 2024-04-09: 1000 mg via INTRAVENOUS
  Filled 2024-04-09: qty 200

## 2024-04-09 MED ORDER — SODIUM CHLORIDE 0.9 % IV SOLN
2.0000 g | Freq: Once | INTRAVENOUS | Status: AC
  Administered 2024-04-09: 2 g via INTRAVENOUS
  Filled 2024-04-09: qty 12.5

## 2024-04-09 MED ORDER — SODIUM BICARBONATE 8.4 % IV SOLN
50.0000 meq | Freq: Once | INTRAVENOUS | Status: AC
  Administered 2024-04-09: 50 meq via INTRAVENOUS
  Filled 2024-04-09: qty 50

## 2024-04-09 MED ORDER — POTASSIUM CHLORIDE 10 MEQ/100ML IV SOLN
10.0000 meq | INTRAVENOUS | Status: AC
  Administered 2024-04-09 (×4): 10 meq via INTRAVENOUS
  Filled 2024-04-09 (×4): qty 100

## 2024-04-09 MED ORDER — DEXTROSE IN LACTATED RINGERS 5 % IV SOLN: INTRAVENOUS | Status: AC

## 2024-04-09 MED ORDER — ENOXAPARIN SODIUM 40 MG/0.4ML IJ SOSY
40.0000 mg | PREFILLED_SYRINGE | INTRAMUSCULAR | Status: DC
  Administered 2024-04-09: 40 mg via SUBCUTANEOUS
  Filled 2024-04-09: qty 0.4

## 2024-04-09 MED ORDER — ONDANSETRON HCL 4 MG/2ML IJ SOLN
4.0000 mg | Freq: Once | INTRAMUSCULAR | Status: AC
  Administered 2024-04-09: 4 mg via INTRAVENOUS
  Filled 2024-04-09: qty 2

## 2024-04-09 MED ORDER — METRONIDAZOLE 500 MG/100ML IV SOLN
500.0000 mg | Freq: Once | INTRAVENOUS | Status: AC
  Administered 2024-04-09: 500 mg via INTRAVENOUS
  Filled 2024-04-09: qty 100

## 2024-04-09 MED ORDER — ONDANSETRON HCL 4 MG/2ML IJ SOLN
INTRAMUSCULAR | Status: AC
Start: 2024-04-09 — End: 2024-04-09
  Filled 2024-04-09: qty 2

## 2024-04-09 MED ORDER — ONDANSETRON HCL 4 MG/2ML IJ SOLN
4.0000 mg | Freq: Four times a day (QID) | INTRAMUSCULAR | Status: DC | PRN
  Administered 2024-04-09 – 2024-04-10 (×2): 4 mg via INTRAVENOUS
  Filled 2024-04-09: qty 2

## 2024-04-09 MED ORDER — METOCLOPRAMIDE HCL 5 MG/ML IJ SOLN
10.0000 mg | Freq: Once | INTRAMUSCULAR | Status: AC | PRN
  Administered 2024-04-09: 10 mg via INTRAVENOUS
  Filled 2024-04-09: qty 2

## 2024-04-09 NOTE — Sepsis Progress Note (Signed)
 Notified bedside nurse of need to draw repeat lactic acid.

## 2024-04-09 NOTE — Assessment & Plan Note (Signed)
 Meeting DKA criteria with blood sugar in the 300s, bicarb of 10 pH of 7.07 on VBG BHB greater than 8 Start DKA protocol Aggressive IV fluid hydration Diabetic coordinator consulted Transition to long-acting insulin and sliding scale once gap is closed, BHB within normal limits and blood sugar has normalized Monitor

## 2024-04-09 NOTE — Assessment & Plan Note (Addendum)
 Meeting sepsis versus SIRS criteria with noted heart rate 100s, white count 27.6 Lactate 5.3-->3.2 No overt source of infection noted at present Urinalysis stable Chest x-ray without any focal infiltrate CT angio GI bleed with noted gastritis without any focal colitis Panculture Continue broad-spectrum antibiotics given severe DKA De-escalate as appropriate Monitor

## 2024-04-09 NOTE — Inpatient Diabetes Management (Addendum)
 Inpatient Diabetes Program Recommendations  AACE/ADA: New Consensus Statement on Inpatient Glycemic Control (2015)  Target Ranges:  Prepandial:   less than 140 mg/dL      Peak postprandial:   less than 180 mg/dL (1-2 hours)      Critically ill patients:  140 - 180 mg/dL    Latest Reference Range & Units 04/09/24 03:49 04/09/24 06:07  Beta-Hydroxybutyric Acid 0.05 - 0.27 mmol/L >8.00 (H) >8.00 (H)    Latest Reference Range & Units 04/09/24 04:28  Sodium 135 - 145 mmol/L 133 (L)  Potassium 3.5 - 5.1 mmol/L 3.4 (L)  Chloride 98 - 111 mmol/L 93 (L)  CO2 22 - 32 mmol/L 9 (L)  Glucose 70 - 99 mg/dL 409 (H)  BUN 6 - 20 mg/dL 35 (H)  Creatinine 8.11 - 1.24 mg/dL 9.14 (H)  Calcium 8.9 - 10.3 mg/dL 9.2  Anion gap 5 - 15  31 (H)    Latest Reference Range & Units 04/08/24 17:54 04/09/24 03:35 04/09/24 05:55 04/09/24 06:35  Glucose-Capillary 70 - 99 mg/dL 782 (H) 956 (H) 213 (H)  IV Insulin Drip Started @0606  453 (H)  (H): Data is abnormally high   Admit with: Dental Abscess/ DKA  History: DM2  Home DM Meds: Lantus 25 units at bedtime (NT)        Farxiga 10 mg daily (NT)        Janumet 100/1000 mg daily (NT)        Dexcom G6 CGM  Current Orders: IV Insulin Drip   IV Insulin Drip Started this AM Will follow    ENDO: Dr. Lorelei Rogers Last Seen 12/30/2023 Told ENDO he was only taking Humalog 5 units once daily if CBG >300 Instructions given at that appt: Continue Lantus 25 units at HS Continue Farxiga 10 mg daily Add Janumet ER 100/1000 mg daily    Addendum 11:30am--Met w/ pt at bedside down in the ED.  Discussed with patient diagnosis of DKA (pathophysiology), treatment of DKA, lab results, and transition plan to SQ insulin regimen.  Explained to pt that his dental infection combined with not taking his meds likely led to this DKA.  Pt told me he lost his insurance and ran out of all meds at home weeks ago.  Has not taken Lantus for several weeks and has not taken Farxiga for almost  1 month.  Never started the Janumet even though prescribed in January.  Told me his Medicaid has been recently renewed and he can pick up all his Rxs at the pharmacy when he leaves (told me meds Are waiting for him at [facility] pharmacy).  Pt also told me Dr. Lorelei Rogers upgraded his glucose sensor to the [facility] G7 and he has already downloaded the app to his phone and plans to start the new dexcom G7 sensor when he picks it up from [facility] pharmacy.  Asked pt to please make follow up appt with ENDO asap.  Pt did not have any questions for me at this time.  Reminded pt about the importance of taking meds as prescribed so he can have better CBG control and prevent long term comps.        --Will follow patient during hospitalization--  Langston Pippins RN, MSN, CDCES Diabetes Coordinator Inpatient Glycemic Control Team Team Pager: [phone] (8a-5p)

## 2024-04-09 NOTE — ED Provider Notes (Signed)
 [name] Provider Note    Event Date/Time   First MD Initiated Contact with Patient 04/09/24 0340     (approximate)   History   Hematemesis and Abdominal Pain   HPI  Marcus Santos is a 28 y.o. male history of insulin-dependent diabetes, noncompliant medication, presenting with nausea vomiting, abdominal pain.  States that it started several days ago, had a lot of vomiting, started having some blood and coffee-ground emesis.  Thinks that his stool has been dark but no diarrhea.  He denies any chest pain or shortness of breath, does state he has a cough but no fever.  Has dysuria but no hematuria.  Not on any blood thinners.  Has been off his insulin for a while.  He denies frequent alcohol use or history of liver issues.  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     Physical Exam   Triage Vital Signs: ED Triage Vitals  Encounter Vitals Group     BP 04/09/24 0337 (!) 131/90     Systolic BP Percentile --      Diastolic BP Percentile --      Pulse Rate 04/09/24 0337 (!) 138     Resp 04/09/24 0337 20     Temp 04/09/24 0337 (!) 96.6 F (35.9 C)     Temp Source 04/09/24 0337 Axillary     SpO2 04/09/24 0337 98 %     Weight 04/09/24 0326 150 lb (68 kg)     Height 04/09/24 0326 5\' 7"  (1.702 m)     Head Circumference --      Peak Flow --      Pain Score 04/09/24 0326 8     Pain Loc --      Pain Education --      Exclude from Growth Chart --     Most recent vital signs: Vitals:   04/09/24 0451 04/09/24 0500  BP: 124/75 128/61  Pulse: (!) 125 (!) 123  Resp: 14 14  Temp:    SpO2: 100% 100%     General: Awake, actively vomiting CV:  Good peripheral perfusion.  Resp:  Normal effort.  Clear Abd:  No distention.  Soft, no guarding but diffusely tender Other:  Vomitus  bag showed dark blood mixed with sputum, rectal exam was done with chaperone, brown stool in rectal vault, no melena or hematochezia.   ED Results / Procedures / Treatments   Labs (all labs ordered are listed, but only abnormal results are displayed) Labs Reviewed  CBC - Abnormal; Notable for the following components:      Result Value   WBC 27.6 (*)    Platelets 627 (*)    All other components within normal limits  LACTIC ACID, PLASMA - Abnormal; Notable for the following components:   Lactic Acid, Venous 5.3 (*)    All other components within normal limits  PROTIME-INR - Abnormal; Notable for the following components:   Prothrombin Time 15.5 (*)    All other components within normal limits  BETA-HYDROXYBUTYRIC ACID - Abnormal; Notable for the following components:   Beta-Hydroxybutyric Acid >8.00 (*)    All other components within normal limits  BLOOD GAS, VENOUS - Abnormal; Notable for the following components:   pH, Ven 7.07 (*)    pCO2, Ven 24 (*)  pO2, Ven 49 (*)    Bicarbonate 7.0 (*)    Acid-base deficit 21.7 (*)    All other components within normal limits  COMPREHENSIVE METABOLIC PANEL WITH GFR - Abnormal; Notable for the following components:   Sodium 133 (*)    Potassium 3.4 (*)    Chloride 93 (*)    CO2 9 (*)    Glucose, Bld 491 (*)    BUN 35 (*)    Creatinine, Ser 1.45 (*)    Total Protein 8.4 (*)    Total Bilirubin 2.1 (*)    Anion gap 31 (*)    All other components within normal limits  CBG MONITORING, ED - Abnormal; Notable for the following components:   Glucose-Capillary 464 (*)    All other components within normal limits  RESP PANEL BY RT-PCR (RSV, FLU A&B, COVID)  RVPGX2  CULTURE, BLOOD (ROUTINE X 2)  CULTURE, BLOOD (ROUTINE X 2)  LIPASE, BLOOD  URINALYSIS, ROUTINE W REFLEX MICROSCOPIC  LACTIC ACID, PLASMA  BASIC METABOLIC PANEL WITH GFR  BASIC METABOLIC PANEL WITH GFR  BASIC METABOLIC PANEL WITH GFR  BASIC METABOLIC PANEL WITH GFR  BASIC  METABOLIC PANEL WITH GFR  BETA-HYDROXYBUTYRIC ACID  BETA-HYDROXYBUTYRIC ACID  BETA-HYDROXYBUTYRIC ACID  BETA-HYDROXYBUTYRIC ACID  BETA-HYDROXYBUTYRIC ACID  CBG MONITORING, ED  TYPE AND SCREEN  TROPONIN I (HIGH SENSITIVITY)  TROPONIN I (HIGH SENSITIVITY)     EKG  EKG shows, sinus tachycardia, rate 125, normal case, prolonged QTc, no ischemic ST elevation, T wave flattening in aVL, prolonged QT is new compared to prior   RADIOLOGY On my independent interpretation, chest x-ray without focal consolidation   PROCEDURES:  Critical Care performed: Yes, see critical care procedure note(s)  .Critical Care  Performed by: Shane Darling, MD Authorized by: Shane Darling, MD   Critical care provider statement:    Critical care time (minutes):  50   Critical care was necessary to treat or prevent imminent or life-threatening deterioration of the following conditions:  Endocrine crisis   Critical care was time spent personally by me on the following activities:  Development of treatment plan with patient or surrogate, discussions with consultants, evaluation of patient's response to treatment, examination of patient, ordering and review of laboratory studies, ordering and review of radiographic studies, ordering and performing treatments and interventions, pulse oximetry, re-evaluation of patient's condition and review of old charts    MEDICATIONS ORDERED IN ED: Medications  metroNIDAZOLE (FLAGYL) IVPB 500 mg (has no administration in time range)  vancomycin (VANCOCIN) IVPB 1000 mg/200 mL premix (1,000 mg Intravenous New Bag/Given 04/09/24 0515)  insulin regular, human (MYXREDLIN) 100 units/ 100 mL infusion (has no administration in time range)  lactated ringers infusion (has no administration in time range)  dextrose 5 % in lactated ringers infusion (has no administration in time range)  dextrose 50 % solution 0-50 mL (has no administration in time range)  potassium chloride 10 mEq in 100  mL IVPB (has no administration in time range)  ondansetron (ZOFRAN) injection 4 mg (4 mg Intravenous Given 04/09/24 0332)  lactated ringers bolus 1,000 mL (1,000 mLs Intravenous New Bag/Given 04/09/24 0352)  ondansetron (ZOFRAN) injection 4 mg (4 mg Intravenous Given 04/09/24 0409)  lactated ringers bolus 1,000 mL (0 mLs Intravenous Stopped 04/09/24 0546)  ceFEPIme (MAXIPIME) 2 g in sodium chloride  0.9 % 100 mL IVPB (0 g Intravenous Stopped 04/09/24 0513)  pantoprazole (PROTONIX) injection 80 mg (80 mg Intravenous Given 04/09/24 0427)  HYDROmorphone (DILAUDID) injection 0.5 mg (  0.5 mg Intravenous Given 04/09/24 0426)  metoCLOPramide (REGLAN) injection 10 mg (10 mg Intravenous Given 04/09/24 0426)  sodium bicarbonate injection 50 mEq (50 mEq Intravenous Given 04/09/24 0505)     IMPRESSION / MDM / ASSESSMENT AND PLAN / ED COURSE  I reviewed the triage vital signs and the nursing notes.                              Differential diagnosis includes, but is not limited to, DKA, hypoglycemia, electrolyte derangements, GI bleed, Mallory-Weiss tear, considered Boerhaave but he has no chest pain or crepitus, UTI, sepsis, pneumonia, viral illness.  Will get labs, EKG, troponin, chest x-ray, blood cultures, CT angio abdomen pelvis, IV fluids.   Patient's presentation is most consistent with acute presentation with potential threat to life or bodily function.  Independent interpretation of labs and imaging below.  Initial labs are coming back, he has a metabolic acidosis, white count is 27, will empirically start him on IV antibiotics broad-spectrum.  Discussed with ICU who states that he is appropriate for stepdown.  Discussed with hospitalist who would like the imaging to be done prior to calling them back. Patient signed out pending CT imaging and admission.  The patient is on the cardiac monitor to evaluate for evidence of arrhythmia and/or significant heart rate changes.   Clinical Course as of 04/09/24 1610  Thu  Apr 09, 2024  0543 Consulted ICU who will come to evaluate the patient, states that based on his labs, he is likely able to go to stepdown. [TT]  0548 DG Chest Port 1 View No active disease.  [TT]  [phone] Consulted hospitalist who would like CT imaging to be done prior to repeating them again. [TT]    Clinical Course User Index [TT] Drenda Gentle Richard Champion, MD     FINAL CLINICAL IMPRESSION(S) / ED DIAGNOSES   Final diagnoses:  Metabolic acidosis  Diabetic ketoacidosis without coma associated with other specified diabetes mellitus (HCC)  Hematemesis with nausea  Abdominal pain, unspecified abdominal location  Nausea and vomiting, unspecified vomiting type  Prolonged Q-T interval on ECG  H/O medication noncompliance     Rx / DC Orders   ED Discharge Orders     None        Note:  This document was prepared using Dragon voice recognition software and may include unintentional dictation errors.    Shane Darling, MD 04/09/24 [phone]

## 2024-04-09 NOTE — ED Notes (Signed)
 Tan MD made aware lactic 5.3

## 2024-04-09 NOTE — ED Notes (Signed)
 Tan MD made aware ph 7.07.

## 2024-04-09 NOTE — Consult Note (Signed)
 PHARMACY CONSULT NOTE - ELECTROLYTES  Pharmacy Consult for Electrolyte Monitoring and Replacement   Recent Labs: Height: 5\' 7"  (170.2 cm) Weight: 68 kg (150 lb) IBW/kg (Calculated) : 66.1 Estimated Creatinine Clearance: 117.3 mL/min (by C-G formula based on SCr of 0.9 mg/dL).  Potassium (mmol/L)  Date Value  04/09/2024 3.0 (L)   Magnesium (mg/dL)  Date Value  16/09/9603 2.0   Calcium (mg/dL)  Date Value  54/08/8118 9.1   Albumin (g/dL)  Date Value  14/78/2956 4.3   Sodium (mmol/L)  Date Value  04/09/2024 139   Assessment  Marcus Santos is a 28 y.o. male presenting with hematemesis & abdominal pain and found to be in DKA. PMH significant for uncontrolled diabetes. Pharmacy has been consulted to monitor and replace electrolytes.  Diet: NPO MIVF: LR @ 125 mL/hr  Goal of Therapy: Electrolytes WNL  Plan:  5/8:  K @ 2103 = 3.0 - 2 of 4 KCl bags given - Will order additional KCl 10 mEq IV X 4 to start @ 0100. Q4H BMP checks ordered by MD while on insulin gtt  Check BMP, Mg, Phos with AM labs  Thank you for allowing pharmacy to be a part of this patient's care.  Marcus Santos D, PharmD 04/09/2024 10:17 PM

## 2024-04-09 NOTE — Plan of Care (Signed)
  Problem: Education: Goal: Knowledge of General Education information will improve Description: Including pain rating scale, medication(s)/side effects and non-pharmacologic comfort measures Outcome: Progressing   Problem: Elimination: Goal: Will not experience complications related to bowel motility Outcome: Progressing   Problem: Safety: Goal: Ability to remain free from injury will improve Outcome: Progressing   Problem: Education: Goal: Ability to describe self-care measures that may prevent or decrease complications (Diabetes Survival Skills Education) will improve Outcome: Progressing

## 2024-04-09 NOTE — Assessment & Plan Note (Addendum)
 Noted VBG with a pH of 7 as well as bicarb of 10 on presentation Lactate 5.3-3.3 Suspect severe DKA as predominant etiology Will continue to trend metabolic markers with treatment Follow

## 2024-04-09 NOTE — Progress Notes (Signed)
 Asked by EDP to evaluate patient for ICU admission. Patient presented with main complaint of vomiting then hematemesis.  Upon arrival to ED patient A&O with intermittent small-moderate hematemesis in the setting of recent dental abscess and DKA.  Upon bedside assessment, patient is A&O- uncomfortable appearing- without dyspnea on room air. Vitals are stable, not on vasopressor support. DKA protocol is ordered, I would anticipate his lab values to improve with this treatment. Patient is alert and hemodynamically stable with measures already in place to reverse his metabolic acidosis. He does not meet ICU admission criteria at this time. Please re-consult if needed in the future.       Marcus Santos, AGACNP-BC Acute Care Nurse Practitioner [facility]    [phone] / [phone] Please see Amion for pager details.

## 2024-04-09 NOTE — ED Triage Notes (Signed)
 Patient C/O hematemesis that began about a week ago. Of note, patient is type I DM.

## 2024-04-09 NOTE — Assessment & Plan Note (Signed)
 Intractable nausea and vomiting with?  Bloody emesis in setting of DKA CT abdomen pelvis with noted gastritis IV PPI Hemoglobin 16.7 on presentation Trend Consult GI as appropriate

## 2024-04-09 NOTE — H&P (Addendum)
 History and Physical    Patient: Marcus Santos:[id number] DOB: Sep 04, 1996 DOA: 04/09/2024 DOS: the patient was seen and examined on 04/09/2024 PCP: [facility]  Patient coming from: Home  Chief Complaint:  Chief Complaint  Patient presents with   Hematemesis   Abdominal Pain   HPI: Marcus Santos is a 28 y.o. male with medical history significant of uncontrolled diabetes presenting with SIRS versus sepsis, severe DKA, metabolic acidosis, esophagitis.  Patient was proximately 1 to 2 days of recurrent nausea vomiting.  Minimal to mild abdominal pain.  No reported diarrhea.  Baseline diabetes.  Patient currently not on medication for this.  No fevers or chills.  Patient reports recently having a toothache and taking recurring doses of Goody powders for treatment.  Nausea and vomiting started afterward.  No chest pain or shortness of breath. Presented to the ER afebrile, heart rate 100s, BP stable.  Satting well on room air.  White count 27.6, hemoglobin 6.7, platelets 627, creatinine 1.33.  Glucose 246.  Bicarb 12.  BHB greater than 8.  Lactate 5.3-3.2.  Positive ketones in urinalysis.  COVID flu and RSV negative.  Chest x-ray within normal limits.  CT angio GI bleed showing esophagitis. Review of Systems: As mentioned in the history of present illness. All other systems reviewed and are negative. Past Medical History:  Diagnosis Date   Diabetes mellitus without complication (HCC)    History reviewed. No pertinent surgical history. Social History:  reports that he has quit smoking. His smoking use included e-cigarettes and cigarettes. He has quit using smokeless tobacco. He reports current alcohol use. He reports current drug use. Drug: Marijuana.  Allergies  Allergen Reactions   Citalopram    Dayquil [Pseudoephedrine-Apap-Dm]    Morphine And Codeine     PAtient states "makes me SOB and to have a stroke"   Nyquil Multi-Symptom [Pseudoeph-Doxylamine-Dm-Apap]      History reviewed. No pertinent family history.  Prior to Admission medications   Medication Sig Start Date End Date Taking? Authorizing Provider  amoxicillin -clavulanate (AUGMENTIN ) 875-125 MG tablet Take 1 tablet by mouth 2 (two) times daily for 10 days. 04/06/24 04/16/24  Coralyn Derry, PA-C  ibuprofen (ADVIL) 600 MG tablet Take 1 tablet (600 mg total) by mouth every 6 (six) hours as needed for moderate pain (pain score 4-6). 04/06/24   Coralyn Derry, PA-C  traMADol (ULTRAM) 50 MG tablet Take 1 tablet (50 mg total) by mouth every 6 (six) hours as needed. 04/06/24   Coralyn Derry, PA-C    Physical Exam: Vitals:   04/09/24 0817 04/09/24 0830 04/09/24 0900 04/09/24 0935  BP:  (!) 140/105 (!) 151/112 (!) 134/90  Pulse:  (!) 151 (!) 135 (!) 130  Resp:  19  10  Temp: 98.1 F (36.7 C)     TempSrc: Axillary     SpO2:  100% 100% 100%  Weight:      Height:       Physical Exam Constitutional:      Appearance: He is normal weight.  HENT:     Head: Normocephalic and atraumatic.     Mouth/Throat:     Mouth: Mucous membranes are dry.  Cardiovascular:     Rate and Rhythm: Normal rate and regular rhythm.  Pulmonary:     Effort: Pulmonary effort is normal.  Abdominal:     General: Bowel sounds are normal.  Musculoskeletal:        General: Normal range of motion.  Skin:  General: Skin is warm.  Neurological:     General: No focal deficit present.  Psychiatric:        Mood and Affect: Mood normal.     Data Reviewed:  There are no new results to review at this time.  CT ANGIO GI BLEED CLINICAL DATA:  Hematemesis.  Concern for GI bleed.  EXAM: CTA ABDOMEN AND PELVIS WITHOUT AND WITH CONTRAST  TECHNIQUE: Multidetector CT imaging of the abdomen and pelvis was performed using the standard protocol during bolus administration of intravenous contrast. Multiplanar reconstructed images and MIPs were obtained and reviewed to evaluate the vascular anatomy.  RADIATION DOSE  REDUCTION: This exam was performed according to the departmental dose-optimization program which includes automated exposure control, adjustment of the mA and/or kV according to patient size and/or use of iterative reconstruction technique.  CONTRAST:  100mL OMNIPAQUE IOHEXOL 350 MG/ML SOLN  COMPARISON:  None Available.  FINDINGS: VASCULAR  Aorta: Normal caliber aorta without aneurysm, dissection, vasculitis or significant stenosis.  Celiac: Patent without evidence of aneurysm, dissection, vasculitis or significant stenosis.  SMA: Patent without evidence of aneurysm, dissection, vasculitis or significant stenosis.  Renals: Both renal arteries are patent without evidence of aneurysm, dissection, vasculitis, fibromuscular dysplasia or significant stenosis.  IMA: Patent without evidence of aneurysm, dissection, vasculitis or significant stenosis.  Inflow: Patent without evidence of aneurysm, dissection, vasculitis or significant stenosis.  Proximal Outflow: The visualized proximal outflow is patent.  Veins: The IVC is unremarkable. The SMV, splenic vein, and main portal vein are patent. No portal venous gas.  Review of the MIP images confirms the above findings.  NON-VASCULAR  Lower chest: The visualized lung bases are clear.  No intra-abdominal free air or free fluid.  Hepatobiliary: No focal liver abnormality is seen. No gallstones, gallbladder wall thickening, or biliary dilatation.  Pancreas: Unremarkable. No pancreatic ductal dilatation or surrounding inflammatory changes.  Spleen: Normal in size without focal abnormality.  Adrenals/Urinary Tract: The adrenal glands unremarkable. Multiple nonobstructing left renal calculi measure up to 3 mm. Faint punctate nonobstructing right renal interpolar calculus may be present. There is mild bilateral pelvicaliectasis. The visualized ureters and urinary bladder appear unremarkable.  Stomach/Bowel: There is mild  circumferential thickening of the distal esophagus with induration of the adjacent fat consistent with esophagitis. Clinical correlation is recommended. There is no bowel obstruction. No evidence of active GI bleed. The appendix is normal.  Lymphatic: No adenopathy.  Reproductive: The prostate and seminal vesicles are grossly unremarkable.  Other: None  Musculoskeletal: No acute or significant osseous findings.  IMPRESSION: 1. No evidence of active GI bleed. 2. Findings consistent with esophagitis. 3. Nonobstructing bilateral renal calculi.  Electronically Signed   By: Angus Bark M.D.   On: 04/09/2024 09:41 DG Chest Port 1 View CLINICAL DATA:  Hematemesis.  Suspected sepsis.  EXAM: PORTABLE CHEST 1 VIEW  COMPARISON:  05/03/2020  FINDINGS: Lordotic positioning noted. The heart size and mediastinal contours are within normal limits. Both lungs are clear. The visualized skeletal structures are unremarkable.  IMPRESSION: No active disease.  Electronically Signed   By: Marlyce Sine M.D.   On: 04/09/2024 05:04  Lab Results  Component Value Date   WBC 27.6 (H) 04/09/2024   HGB 16.7 04/09/2024   HCT 48.0 04/09/2024   MCV 87.1 04/09/2024   PLT 627 (H) 04/09/2024   Last metabolic panel Lab Results  Component Value Date   GLUCOSE 246 (H) 04/09/2024   NA 139 04/09/2024   K 3.3 (L) 04/09/2024  CL 105 04/09/2024   CO2 12 (L) 04/09/2024   BUN 32 (H) 04/09/2024   CREATININE 1.33 (H) 04/09/2024   GFRNONAA >60 04/09/2024   CALCIUM 9.4 04/09/2024   PROT 8.4 (H) 04/09/2024   ALBUMIN 4.3 04/09/2024   BILITOT 2.1 (H) 04/09/2024   ALKPHOS 111 04/09/2024   AST 18 04/09/2024   ALT 19 04/09/2024   ANIONGAP 22 (H) 04/09/2024    Assessment and Plan: DKA (diabetic ketoacidosis) (HCC) Meeting DKA criteria with blood sugar in the 300s, bicarb of 10 pH of 7.07 on VBG BHB greater than 8 Start DKA protocol Aggressive IV fluid hydration Diabetic coordinator  consulted Transition to long-acting insulin and sliding scale once gap is closed, BHB within normal limits and blood sugar has normalized Monitor  Sepsis (HCC) Meeting sepsis versus SIRS criteria with noted heart rate 100s, white count 27.6 Lactate 5.3-->3.2 No overt source of infection noted at present Urinalysis stable Chest x-ray without any focal infiltrate CT angio GI bleed with noted gastritis without any focal colitis Panculture Continue broad-spectrum antibiotics given severe DKA De-escalate as appropriate Monitor  Esophagitis Intractable nausea and vomiting with?  Bloody emesis in setting of DKA CT abdomen pelvis with noted esophagitis  IV PPI Hemoglobin 16.7 on presentation Trend Consult GI as appropriate   Metabolic acidosis Noted VBG with a pH of 7 as well as bicarb of 10 on presentation Lactate 5.3-3.3 Suspect severe DKA as predominant etiology Will continue to trend metabolic markers with treatment Follow    Greater than 50% was spent in counseling and coordination of care with patient Critical care time: 60+ minutes     Advance Care Planning:   Code Status: Full Code   Consults: None   Family Communication: No family at the bedside   Severity of Illness: The appropriate patient status for this patient is OBSERVATION. Observation status is judged to be reasonable and necessary in order to provide the required intensity of service to ensure the patient's safety. The patient's presenting symptoms, physical exam findings, and initial radiographic and laboratory data in the context of their medical condition is felt to place them at decreased risk for further clinical deterioration. Furthermore, it is anticipated that the patient will be medically stable for discharge from the hospital within 2 midnights of admission.   Author: Corrinne Din, MD 04/09/2024 10:17 AM  For on call review [email or web address].

## 2024-04-09 NOTE — Progress Notes (Signed)
 CODE SEPSIS - PHARMACY COMMUNICATION  **Broad Spectrum Antibiotics should be administered within 1 hour of Sepsis diagnosis**  Time Code Sepsis Called/Page Received: 5/8 @ 0415   Antibiotics Ordered: Vanc, cefepime   Time of 1st antibiotic administration: Cefepime 2 gm IV X 1 on 5/8 @ 0442   Additional action taken by pharmacy:   If necessary, Name of Provider/Nurse Contacted:     Trevor Wilkie D ,PharmD Clinical Pharmacist  04/09/2024  5:46 AM

## 2024-04-09 NOTE — Sepsis Progress Note (Signed)
 Elink monitoring for the code sepsis protocol.

## 2024-04-09 NOTE — Consult Note (Signed)
 PHARMACY CONSULT NOTE - ELECTROLYTES  Pharmacy Consult for Electrolyte Monitoring and Replacement   Recent Labs: Height: 5\' 7"  (170.2 cm) Weight: 68 kg (150 lb) IBW/kg (Calculated) : 66.1 Estimated Creatinine Clearance: 79.4 mL/min (A) (by C-G formula based on SCr of 1.33 mg/dL (H)).  Potassium (mmol/L)  Date Value  04/09/2024 3.2 (L)   Magnesium (mg/dL)  Date Value  09/81/1914 2.0   Calcium (mg/dL)  Date Value  78/29/5621 9.3   Albumin (g/dL)  Date Value  30/86/5784 4.3   Sodium (mmol/L)  Date Value  04/09/2024 141   Assessment  Marcus Santos is a 28 y.o. male presenting with hematemesis & abdominal pain and found to be in DKA. PMH significant for uncontrolled diabetes. Pharmacy has been consulted to monitor and replace electrolytes.  Diet: NPO MIVF: LR @ 125 mL/hr  Goal of Therapy: Electrolytes WNL  Plan:  K+ 3.4 > 3.1 > 3.2 s/p 40 mEq of KCl replacement  Will order an additional KCl 10 mEq IV x 4 doses tonight as expect K+ to continue to decrease while on insulin gtt  Q4H BMP checks ordered by MD while on insulin gtt  Check BMP, Mg, Phos with AM labs  Thank you for allowing pharmacy to be a part of this patient's care.  Pansy Bogus, PharmD Pharmacy Resident  04/09/2024 5:24 PM

## 2024-04-09 NOTE — Assessment & Plan Note (Addendum)
 Intractable nausea and vomiting with?  Bloody emesis in setting of DKA Noted high dose NSAID (goody powder( use  CT abdomen pelvis with noted esophagitis  IV PPI Hemoglobin 16.7 on presentation Trend Consult GI as appropriate

## 2024-04-10 ENCOUNTER — Other Ambulatory Visit: Payer: Self-pay

## 2024-04-10 LAB — BASIC METABOLIC PANEL WITH GFR
Anion gap: 10 (ref 5–15)
BUN: 17 mg/dL (ref 6–20)
CO2: 19 mmol/L — ABNORMAL LOW (ref 22–32)
Calcium: 9 mg/dL (ref 8.9–10.3)
Chloride: 108 mmol/L (ref 98–111)
Creatinine, Ser: 0.78 mg/dL (ref 0.61–1.24)
GFR, Estimated: >60 mL/min (ref >60)
Glucose, Bld: 186 mg/dL — ABNORMAL HIGH (ref 70–99)
Potassium: 3.3 mmol/L — ABNORMAL LOW (ref 3.5–5.1)
Sodium: 137 mmol/L (ref 135–145)

## 2024-04-10 LAB — CBC
HCT: 34.4 % — ABNORMAL LOW (ref 39.0–52.0)
Hemoglobin: 12.7 g/dL — ABNORMAL LOW (ref 13.0–17.0)
MCH: 30.5 pg (ref 26.0–34.0)
MCHC: 36.9 g/dL — ABNORMAL HIGH (ref 30.0–36.0)
MCV: 82.5 fL (ref 80.0–100.0)
Platelets: 386 10*3/uL (ref 150–400)
RBC: 4.17 MIL/uL — ABNORMAL LOW (ref 4.22–5.81)
RDW: 11.7 % (ref 11.5–15.5)
WBC: 16.2 10*3/uL — ABNORMAL HIGH (ref 4.0–10.5)
nRBC: 0 % (ref 0.0–0.2)

## 2024-04-10 LAB — GLUCOSE, CAPILLARY
Glucose-Capillary: 122 mg/dL — ABNORMAL HIGH (ref 70–99)
Glucose-Capillary: 142 mg/dL — ABNORMAL HIGH (ref 70–99)
Glucose-Capillary: 157 mg/dL — ABNORMAL HIGH (ref 70–99)
Glucose-Capillary: 159 mg/dL — ABNORMAL HIGH (ref 70–99)
Glucose-Capillary: 160 mg/dL — ABNORMAL HIGH (ref 70–99)
Glucose-Capillary: 165 mg/dL — ABNORMAL HIGH (ref 70–99)
Glucose-Capillary: 182 mg/dL — ABNORMAL HIGH (ref 70–99)
Glucose-Capillary: 201 mg/dL — ABNORMAL HIGH (ref 70–99)

## 2024-04-10 LAB — HEMOGLOBIN A1C
Hgb A1c MFr Bld: 11.6 % — ABNORMAL HIGH (ref 4.8–5.6)
Mean Plasma Glucose: 286.22 mg/dL

## 2024-04-10 LAB — PHOSPHORUS: Phosphorus: 1.3 mg/dL — ABNORMAL LOW (ref 2.5–4.6)

## 2024-04-10 LAB — MAGNESIUM: Magnesium: 2.2 mg/dL (ref 1.7–2.4)

## 2024-04-10 MED ORDER — INSULIN ASPART 100 UNIT/ML IJ SOLN: 0.0000 [IU] | Freq: Every day | INTRAMUSCULAR | Status: DC

## 2024-04-10 MED ORDER — INSULIN ASPART 100 UNIT/ML IJ SOLN
2.0000 [IU] | Freq: Three times a day (TID) | INTRAMUSCULAR | Status: DC
  Administered 2024-04-10: 2 [IU] via SUBCUTANEOUS
  Filled 2024-04-10: qty 1

## 2024-04-10 MED ORDER — POTASSIUM CHLORIDE CRYS ER 20 MEQ PO TBCR
40.0000 meq | EXTENDED_RELEASE_TABLET | Freq: Once | ORAL | Status: AC
  Administered 2024-04-10: 40 meq via ORAL
  Filled 2024-04-10: qty 2

## 2024-04-10 MED ORDER — INSULIN ASPART 100 UNIT/ML IJ SOLN
0.0000 [IU] | Freq: Three times a day (TID) | INTRAMUSCULAR | Status: DC
  Administered 2024-04-10: 3 [IU] via SUBCUTANEOUS
  Filled 2024-04-10: qty 1

## 2024-04-10 MED ORDER — INSULIN GLARGINE-YFGN 100 UNIT/ML ~~LOC~~ SOLN
20.0000 [IU] | Freq: Every day | SUBCUTANEOUS | Status: DC
  Administered 2024-04-10: 20 [IU] via SUBCUTANEOUS
  Filled 2024-04-10: qty 0.2

## 2024-04-10 MED ORDER — POTASSIUM PHOSPHATES 15 MMOLE/5ML IV SOLN
45.0000 mmol | Freq: Once | INTRAVENOUS | Status: AC
  Administered 2024-04-10: 45 mmol via INTRAVENOUS
  Filled 2024-04-10: qty 15

## 2024-04-10 NOTE — Progress Notes (Signed)
 The patient called this nurse in to request to be discharged. MD, Dr Mason Sole, made aware. Patient signed Against Medical Advice form, witnessed by this nurse. All IV's removed. Patient discharged with his cell phone, wallet, shoes, shirt and shorts. Patient walked off unit with his father.

## 2024-04-10 NOTE — Plan of Care (Signed)
  Problem: Education: Goal: Knowledge of General Education information will improve Description: Including pain rating scale, medication(s)/side effects and non-pharmacologic comfort measures Outcome: Progressing   Problem: Health Behavior/Discharge Planning: Goal: Ability to manage health-related needs will improve Outcome: Progressing   Problem: Clinical Measurements: Goal: Ability to maintain clinical measurements within normal limits will improve Outcome: Progressing Goal: Will remain free from infection Outcome: Progressing Goal: Diagnostic test results will improve Outcome: Progressing   Problem: Metabolic: Goal: Ability to maintain appropriate glucose levels will improve Outcome: Progressing

## 2024-04-10 NOTE — Progress Notes (Signed)
 Advised patient to check CBG at home at 1600. Advised patient to return to call 911 if admitting symptoms reoccur. Patient verbalized understanding.

## 2024-04-10 NOTE — Consult Note (Signed)
 PHARMACY CONSULT NOTE - ELECTROLYTES  Pharmacy Consult for Electrolyte Monitoring and Replacement   Recent Labs: Potassium (mmol/L)  Date Value  04/10/2024 3.3 (L)   Magnesium (mg/dL)  Date Value  16/09/9603 2.2   Calcium (mg/dL)  Date Value  54/08/8118 9.0   Albumin (g/dL)  Date Value  14/78/2956 4.3   Phosphorus (mg/dL)  Date Value  21/30/8657 1.3 (L)   Sodium (mmol/L)  Date Value  04/10/2024 137   Height: 5\' 7"  (170.2 cm) Weight: 68 kg (150 lb) IBW/kg (Calculated) : 66.1 Estimated Creatinine Clearance: 132 mL/min (by C-G formula based on SCr of 0.78 mg/dL).  Assessment  Marcus Santos is a 28 y.o. male presenting with DKA. PMH significant for un-controlled diabetes. Pharmacy has been consulted to monitor and replace electrolytes.  Diet: NPO MIVF: N/A Pertinent medications: N/A  Goal of Therapy: Electrolytes within normal limits  Plan:  K 3.3, Kcl 40 mEq PO x 1 dose when started on diet. Transitioned off insulin  gtt onto [place] regimen Re-check electrolytes tomorrow AM if patient not discharged  Thank you for allowing pharmacy to be a part of this patient's care.  [name] 04/10/2024 11:01 AM

## 2024-04-10 NOTE — Inpatient Diabetes Management (Signed)
 Inpatient Diabetes Program Recommendations  AACE/ADA: New Consensus Statement on Inpatient Glycemic Control (2015)  Target Ranges:  Prepandial:   less than 140 mg/dL      Peak postprandial:   less than 180 mg/dL (1-2 hours)      Critically ill patients:  140 - 180 mg/dL    Latest Reference Range & Units 04/09/24 18:19 04/09/24 20:18 04/09/24 22:25 04/10/24 00:26 04/10/24 02:31 04/10/24 03:35 04/10/24 04:35 04/10/24 05:42 04/10/24 07:47  Glucose-Capillary 70 - 99 mg/dL 295 (H) 621 (H) 308 (H) 165 (H) 122 (H) 142 (H) 159 (H) 157 (H) 160 (H)  IV Insulin  Drip Running  (H): Data is abnormally high   Admit with: Dental Abscess/ DKA   History: DM2   Home DM Meds: Lantus 25 units at bedtime (NT)                              Farxiga 10 mg daily (NT)                              Janumet 100/1000 mg daily (NT)                              Dexcom G6 CGM   Current Orders: IV Insulin  Drip     Note BMET Pending for this AM Last BMET at 9pm last evening showed Anion Gap down to 13 and CO2 level up to 18  When transitioning to SQ Insulin , recommend the following:  1. Start Semglee 20 units daily (80% home dose) Can turn IV Insulin  Drip off 2 hours after Semglee administered  2. Start Novolog Sensitive Correction Scale/ SSI (0-9 units) TID AC + HS  3. Start low dose Novolog Meal Coverage: Novolog 2 units TID with meals HOLD if pt NPO HOLD if pt eats <50% meals    ENDO: Dr. Lorelei Rogers Last Seen 12/30/2023 Told ENDO he was only taking Humalog 5 units once daily if CBG >300 Instructions given at that appt: Continue Lantus 25 units at HS Continue Farxiga 10 mg daily Add Janumet ER 100/1000 mg daily    --Will follow patient during hospitalization--  Langston Pippins RN, MSN, CDCES Diabetes Coordinator Inpatient Glycemic Control Team Team Pager: [phone] (8a-5p)

## 2024-04-10 NOTE — Progress Notes (Signed)
 PHARMACY CONSULT NOTE - FOLLOW UP  Pharmacy Consult for Electrolyte Monitoring and Replacement   Recent Labs: Potassium (mmol/L)  Date Value  04/09/2024 3.0 (L)   Magnesium (mg/dL)  Date Value  25/36/6440 2.2   Calcium (mg/dL)  Date Value  34/74/2595 9.1   Albumin (g/dL)  Date Value  63/87/5643 4.3   Phosphorus (mg/dL)  Date Value  32/95/1884 1.3 (L)   Sodium (mmol/L)  Date Value  04/09/2024 139     Assessment: 5/9 :  Phos = 1.3   Goal of Therapy:  Electrolytes WNL   Plan:  K Phos 45 mmol IVPB ordered for 5/9 @ 0600.   Alvenia Job ,PharmD Clinical Pharmacist 04/10/2024 5:38 AM

## 2024-04-14 LAB — CULTURE, BLOOD (ROUTINE X 2)
Culture: NO GROWTH
Culture: NO GROWTH

## 2024-04-23 NOTE — Discharge Summary (Signed)
 Physician Discharge Summary   Patient: Marcus Santos MRN: [id number] DOB: 11-30-1996  Admit date:     04/09/2024  Discharge date: 04/10/2024  Discharge Physician: Corrinne Din   PCP: [facility]   Recommendations at discharge:    Patient left hospital AGAINST MEDICAL ADVICE  Discharge Diagnoses: Active Problems:   DKA (diabetic ketoacidosis) (HCC)   Sepsis (HCC)   Esophagitis   Metabolic acidosis  Principal Problem (Resolved):   Acute respiratory failure with hypoxia (HCC) Resolved Problems:   Gastritis  Hospital Course: No notes on file  Assessment and Plan: DKA (diabetic ketoacidosis) (HCC) Meeting DKA criteria with blood sugar in the 300s, bicarb of 10 pH of 7.07 on VBG BHB greater than 8 Start DKA protocol Aggressive IV fluid hydration Diabetic coordinator consulted Transition to long-acting insulin  and sliding scale once gap is closed, BHB within normal limits and blood sugar has normalized Monitor  Sepsis (HCC) Meeting sepsis versus SIRS criteria with noted heart rate 100s, white count 27.6 Lactate 5.3-->3.2 No overt source of infection noted at present Urinalysis stable Chest x-ray without any focal infiltrate CT angio GI bleed with noted gastritis without any focal colitis Panculture Continue broad-spectrum antibiotics given severe DKA De-escalate as appropriate Monitor  Esophagitis Intractable nausea and vomiting with?  Bloody emesis in setting of DKA Noted high dose NSAID (goody powder( use  CT abdomen pelvis with noted esophagitis  IV PPI Hemoglobin 16.7 on presentation Trend Consult GI as appropriate  Gastritis-resolved as of 04/09/2024 Intractable nausea and vomiting with?  Bloody emesis in setting of DKA CT abdomen pelvis with noted gastritis IV PPI Hemoglobin 16.7 on presentation Trend Consult GI as appropriate   Metabolic acidosis Noted VBG with a pH of 7 as well as bicarb of 10 on presentation Lactate  5.3-3.3 Suspect severe DKA as predominant etiology Will continue to trend metabolic markers with treatment Follow         Consultants: NONE Procedures performed: NONE  Disposition: PT LEFT HOSPITAL AGAINST MEDICAL ADVICE  Diet recommendation:   DISCHARGE MEDICATION: Allergies as of 04/10/2024       Reactions   Bupropion Swelling, Nausea Only, Other (See Comments)   numbness   Citalopram    Dayquil [pseudoephedrine-apap-dm]    Morphine And Codeine    PAtient states "makes me SOB and to have a stroke"   Nyquil Multi-symptom [pseudoeph-doxylamine-dm-apap]         Medication List     ASK your doctor about these medications    amoxicillin -clavulanate 875-125 MG tablet Commonly known as: AUGMENTIN  Take 1 tablet by mouth 2 (two) times daily for 10 days. Ask about: Should I take this medication?   gabapentin 800 MG tablet Commonly known as: NEURONTIN Take 800 mg by mouth 3 (three) times daily.   ibuprofen  600 MG tablet Commonly known as: ADVIL  Take 1 tablet (600 mg total) by mouth every 6 (six) hours as needed for moderate pain (pain score 4-6).   traMADol  50 MG tablet Commonly known as: Ultram  Take 1 tablet (50 mg total) by mouth every 6 (six) hours as needed.        Discharge Exam: Filed Weights   04/09/24 0326  Weight: 68 kg   PT LEFT AGAINST MEDICAL ADVICE PRIOR TO EVALUATION  Condition at discharge: PT LEFT AGAINST MEDICAL ADVICE PRIOR TO EVALUATION  The results of significant diagnostics from this hospitalization (including imaging, microbiology, ancillary and laboratory) are listed below for reference.   Imaging Studies: CT ANGIO GI BLEED  Result Date: 04/09/2024 CLINICAL DATA:  Hematemesis.  Concern for GI bleed. EXAM: CTA ABDOMEN AND PELVIS WITHOUT AND WITH CONTRAST TECHNIQUE: Multidetector CT imaging of the abdomen and pelvis was performed using the standard protocol during bolus administration of intravenous contrast. Multiplanar reconstructed  images and MIPs were obtained and reviewed to evaluate the vascular anatomy. RADIATION DOSE REDUCTION: This exam was performed according to the departmental dose-optimization program which includes automated exposure control, adjustment of the mA and/or kV according to patient size and/or use of iterative reconstruction technique. CONTRAST:  OMNIPAQUE  IOHEXOL  350 MG/ML SOLN COMPARISON:  None Available. FINDINGS: VASCULAR Aorta: Normal caliber aorta without aneurysm, dissection, vasculitis or significant stenosis. Celiac: Patent without evidence of aneurysm, dissection, vasculitis or significant stenosis. SMA: Patent without evidence of aneurysm, dissection, vasculitis or significant stenosis. Renals: Both renal arteries are patent without evidence of aneurysm, dissection, vasculitis, fibromuscular dysplasia or significant stenosis. IMA: Patent without evidence of aneurysm, dissection, vasculitis or significant stenosis. Inflow: Patent without evidence of aneurysm, dissection, vasculitis or significant stenosis. Proximal Outflow: The visualized proximal outflow is patent. Veins: The IVC is unremarkable. The SMV, splenic vein, and main portal vein are patent. No portal venous gas. Review of the MIP images confirms the above findings. NON-VASCULAR Lower chest: The visualized lung bases are clear. No intra-abdominal free air or free fluid. Hepatobiliary: No focal liver abnormality is seen. No gallstones, gallbladder wall thickening, or biliary dilatation. Pancreas: Unremarkable. No pancreatic ductal dilatation or surrounding inflammatory changes. Spleen: Normal in size without focal abnormality. Adrenals/Urinary Tract: The adrenal glands unremarkable. Multiple nonobstructing left renal calculi measure up to 3 mm. Faint punctate nonobstructing right renal interpolar calculus may be present. There is mild bilateral pelvicaliectasis. The visualized ureters and urinary bladder appear unremarkable. Stomach/Bowel: There  is mild circumferential thickening of the distal esophagus with induration of the adjacent fat consistent with esophagitis. Clinical correlation is recommended. There is no bowel obstruction. No evidence of active GI bleed. The appendix is normal. Lymphatic: No adenopathy. Reproductive: The prostate and seminal vesicles are grossly unremarkable. Other: None Musculoskeletal: No acute or significant osseous findings. IMPRESSION: 1. No evidence of active GI bleed. 2. Findings consistent with esophagitis. 3. Nonobstructing bilateral renal calculi. Electronically Signed   By: Angus Bark M.D.   On: 04/09/2024 09:41   DG Chest Port 1 View Result Date: 04/09/2024 CLINICAL DATA:  Hematemesis.  Suspected sepsis. EXAM: PORTABLE CHEST 1 VIEW COMPARISON:  05/03/2020 FINDINGS: Lordotic positioning noted. The heart size and mediastinal contours are within normal limits. Both lungs are clear. The visualized skeletal structures are unremarkable. IMPRESSION: No active disease. Electronically Signed   By: Marlyce Sine M.D.   On: 04/09/2024 05:04   CT Maxillofacial W Contrast Result Date: 04/06/2024 CLINICAL DATA:  Dental pain EXAM: CT MAXILLOFACIAL WITH CONTRAST TECHNIQUE: Multidetector CT imaging of the maxillofacial structures was performed with intravenous contrast. Multiplanar CT image reconstructions were also generated. RADIATION DOSE REDUCTION: This exam was performed according to the departmental dose-optimization program which includes automated exposure control, adjustment of the mA and/or kV according to patient size and/or use of iterative reconstruction technique. CONTRAST:  75mL OMNIPAQUE  IOHEXOL  300 MG/ML  SOLN COMPARISON:  None Available. FINDINGS: Osseous: No fracture or mandibular dislocation. No destructive process. Orbits: Negative. No traumatic or inflammatory finding. Sinuses: Inferior right maxillary sinus mucosal thickening. Soft tissues: Negative. Limited intracranial: No significant or unexpected  finding. Other: Many dental caries and periapical lucencies. IMPRESSION: 1. No visible edema or discrete drainable fluid collection. 2. Many dental  caries and periapical lucencies. Inferior right maxillary sinus mucosal thickening may odontogenic. Electronically Signed   By: Stevenson Elbe M.D.   On: 04/06/2024 20:52    Microbiology: Results for orders placed or performed during the hospital encounter of 04/09/24  Resp panel by RT-PCR (RSV, Flu A&B, Covid) Anterior Nasal Swab     Status: None   Collection Time: 04/09/24  4:28 AM   Specimen: Anterior Nasal Swab  Result Value Ref Range Status   SARS Coronavirus 2 by RT PCR NEGATIVE NEGATIVE Final    Comment: (NOTE) SARS-CoV-2 target nucleic acids are NOT DETECTED.  The SARS-CoV-2 RNA is generally detectable in upper respiratory specimens during the acute phase of infection. The lowest concentration of SARS-CoV-2 viral copies this assay can detect is 138 copies/mL. A negative result does not preclude SARS-Cov-2 infection and should not be used as the sole basis for treatment or other patient management decisions. A negative result may occur with  improper specimen collection/handling, submission of specimen other than nasopharyngeal swab, presence of viral mutation(s) within the areas targeted by this assay, and inadequate number of viral copies(<138 copies/mL). A negative result must be combined with clinical observations, patient history, and epidemiological information. The expected result is Negative.  Fact Sheet for Patients:  BloggerCourse.com  Fact Sheet for Healthcare Providers:  SeriousBroker.it  This test is no t yet approved or cleared by the [place]  FDA and  has been authorized for detection and/or diagnosis of SARS-CoV-2 by FDA under an Emergency Use Authorization (EUA). This EUA will remain  in effect (meaning this test can be used) for the duration of  the COVID-19 declaration under Section 564(b)(1) of the Act, 21 U.S.C.section 360bbb-3(b)(1), unless the authorization is terminated  or revoked sooner.       Influenza A by PCR NEGATIVE NEGATIVE Final   Influenza B by PCR NEGATIVE NEGATIVE Final    Comment: (NOTE) The Xpert Xpress SARS-CoV-2/FLU/RSV plus assay is intended as an aid in the diagnosis of influenza from Nasopharyngeal swab specimens and should not be used as a sole basis for treatment. Nasal washings and aspirates are unacceptable for Xpert Xpress SARS-CoV-2/FLU/RSV testing.  Fact Sheet for Patients: BloggerCourse.com  Fact Sheet for Healthcare Providers: SeriousBroker.it  This test is not yet approved or cleared by the [place]  FDA and has been authorized for detection and/or diagnosis of SARS-CoV-2 by FDA under an Emergency Use Authorization (EUA). This EUA will remain in effect (meaning this test can be used) for the duration of the COVID-19 declaration under Section 564(b)(1) of the Act, 21 U.S.C. section 360bbb-3(b)(1), unless the authorization is terminated or revoked.     Resp Syncytial Virus by PCR NEGATIVE NEGATIVE Final    Comment: (NOTE) Fact Sheet for Patients: BloggerCourse.com  Fact Sheet for Healthcare Providers: SeriousBroker.it  This test is not yet approved or cleared by the [place]  FDA and has been authorized for detection and/or diagnosis of SARS-CoV-2 by FDA under an Emergency Use Authorization (EUA). This EUA will remain in effect (meaning this test can be used) for the duration of the COVID-19 declaration under Section 564(b)(1) of the Act, 21 U.S.C. section 360bbb-3(b)(1), unless the authorization is terminated or revoked.  Performed at [facility], [address] [place], [place] [address]   Blood Culture (routine x 2)     Status: None   Collection Time:  04/09/24  4:28 AM   Specimen: BLOOD  Result Value Ref Range Status   Specimen Description BLOOD LEFT FA  Final   Special Requests   Final    BOTTLES DRAWN AEROBIC AND ANAEROBIC Blood Culture results may not be optimal due to an inadequate volume of blood received in culture bottles   Culture   Final    NO GROWTH 5 DAYS Performed at [facility], [address] [place], [place] [address]    Report Status 04/14/2024 FINAL  Final  Blood Culture (routine x 2)     Status: None   Collection Time: 04/09/24  4:28 AM   Specimen: BLOOD RIGHT ARM  Result Value Ref Range Status   Specimen Description BLOOD RIGHT ARM  Final   Special Requests   Final    BOTTLES DRAWN AEROBIC AND ANAEROBIC Blood Culture results may not be optimal due to an inadequate volume of blood received in culture bottles   Culture   Final    NO GROWTH 5 DAYS Performed at [facility], [address] [place], [place] [address]    Report Status 04/14/2024 FINAL  Final  MRSA Next Gen by PCR, Nasal     Status: None   Collection Time: 04/09/24  2:52 PM   Specimen: Nasal Mucosa; Nasal Swab  Result Value Ref Range Status   MRSA by PCR Next Gen NOT DETECTED NOT DETECTED Final    Comment: (NOTE) The GeneXpert MRSA Assay (FDA approved for NASAL specimens only), is one component of a comprehensive MRSA colonization surveillance program. It is not intended to diagnose MRSA infection nor to guide or monitor treatment for MRSA infections. Test performance is not FDA approved in patients less than 66 years old. Performed at [facility], [address] [place], [place] [address]     Labs: CBC: No results for input(s): "WBC", "NEUTROABS", "HGB", "HCT", "MCV", "PLT" in the last 168 hours. Basic Metabolic Panel: No results for input(s): "NA", "K", "CL", "CO2", "GLUCOSE", "BUN", "CREATININE", "CALCIUM", "MG", "PHOS" in the last 168 hours. Liver Function Tests: No results for input(s): "AST", "ALT",  "ALKPHOS", "BILITOT", "PROT", "ALBUMIN" in the last 168 hours. CBG: No results for input(s): "GLUCAP" in the last 168 hours.  Discharge time spent: less than 30 minutes.  Signed: Corrinne Din, MD [facility] 04/23/2024

## 2024-08-24 ENCOUNTER — Encounter: Payer: Self-pay | Admitting: Nurse Practitioner

## 2024-08-24 ENCOUNTER — Ambulatory Visit: Attending: Nurse Practitioner | Admitting: Nurse Practitioner

## 2024-08-24 VITALS — BP 123/85 | HR 110 | Temp 98.2°F | Resp 18 | Ht 67.0 in | Wt 160.0 lb

## 2024-08-24 DIAGNOSIS — E872 Acidosis, unspecified: Secondary | ICD-10-CM | POA: Diagnosis present

## 2024-08-24 DIAGNOSIS — M5441 Lumbago with sciatica, right side: Secondary | ICD-10-CM | POA: Diagnosis present

## 2024-08-24 DIAGNOSIS — M5442 Lumbago with sciatica, left side: Secondary | ICD-10-CM | POA: Insufficient documentation

## 2024-08-24 DIAGNOSIS — E1110 Type 2 diabetes mellitus with ketoacidosis without coma: Secondary | ICD-10-CM | POA: Diagnosis not present

## 2024-08-24 DIAGNOSIS — G894 Chronic pain syndrome: Secondary | ICD-10-CM | POA: Insufficient documentation

## 2024-08-24 DIAGNOSIS — Z794 Long term (current) use of insulin: Secondary | ICD-10-CM

## 2024-08-24 NOTE — Progress Notes (Signed)
 Safety precautions to be maintained throughout the outpatient stay will include: orient to surroundings, keep bed in low position, maintain call bell within reach at all times, provide assistance with transfer out of bed and ambulation.

## 2024-08-24 NOTE — Progress Notes (Signed)
 PROVIDER NOTE: Interpretation of information contained herein should be left to medically-trained personnel. Specific patient instructions are provided elsewhere under Patient Instructions section of medical record. This document was created in part using AI and STT-dictation technology, any transcriptional errors that may result from this process are unintentional.  Patient: Marcus Santos  Service: E/M Encounter  Provider: Emmy MARLA Blanch, NP  DOB: 10/29/1996  Delivery: Face-to-face  Specialty: Interventional Pain Management  MRN: [id number]  Setting: Ambulatory outpatient facility  Specialty designation: 09  Type: New Patient  Location: Outpatient office facility  PCP: [facility]  DOS: 08/24/2024    Referring Prov.: Lane Arthea BRAVO, MD   Primary Reason(s) for Visit: Encounter for initial evaluation of one or more chronic problems (new to examiner) potentially causing chronic pain, and posing a threat to normal musculoskeletal function. (Level of risk: High) CC: Back Pain (Lower Back), Leg Pain (From Bilateral Hip to Bilateral toes, has numbness and tingling ), and Peripheral Neuropathy (Diabetes was previously uncontrolled Diabetic. Now has better handle on is care. Current A1C is 7.5. )  HPI  Mr. Purnell is a 28 y.o. year old, male patient, who comes for the first time to our practice referred by Potter, Zachary E, MD for our initial evaluation of his chronic pain. 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 Today he comes in for evaluation of his Back Pain (Lower Back), Leg Pain (From Bilateral Hip to Bilateral toes, has numbness and tingling ), and Peripheral Neuropathy (Diabetes was previously uncontrolled Diabetic. Now has better handle on is care. Current A1C is 7.5. )  Pain Assessment: Location: Lower Back Radiating: Radiating down Bilateral legs, Bilateral  feet and toes Onset: More than a month ago Duration: Neuropathic pain, Chronic pain Quality:   Severity: 10-Worst pain ever/10 (subjective, self-reported pain score)  Effect on ADL: Limits ADL's Timing: Constant Modifying factors: Gabapentin a little BP: 123/85  HR: (!) 110  Onset and Duration: Present longer than 3 months Cause of pain:  Severity: Getting worse Timing: Morning, Noon, Afternoon, Evening, Night, During activity or exercise, After activity or exercise, and After a period of immobility Aggravating Factors: Bending, Climbing, Intercourse (sex), Lifiting, Prolonged sitting, Prolonged standing, Squatting, Twisting, Walking, Walking uphill, Walking downhill, and Working Alleviating Factors: Medications, Sleeping, and Warm showers or baths Associated Problems: Numbness, Spasms, Swelling, Tingling, Weakness, Pain that wakes patient up, and Pain that does not allow patient to sleep Quality of Pain: Exhausting, Pressure-like, Pulsating, Sharp, Shooting, Stabbing, Tender, Throbbing, Tingling, and Tiring Previous Examinations or Tests: The patient denies   Previous Treatments: The patient denies    Mr. Gras is being evaluated for possible interventional pain management therapies for the treatment of his chronic pain.   The patient presents for evaluation and management of chronic low back pain and bilateral foot pain associated with numbness and tingling.  He continues to experience low back pain radiating to both feet, though without burning sensation.  He has not undergone any prior interventional procedures, physical therapy, or imaging studies for his low back pain.  His symptoms have been ongoing since the age of 59.  The patient has a history of uncontrolled diabetes with prior episodes of ketoacidosis; his most recent hemoglobin A1c 7.2.  We discussed Qutenza treatment as a potential option; however his insurance does not provide coverage for this therapy.  We also reviewed  the plan to obtain a  lumbar MRI for further evaluation of his low back pain  He was previously scheduled for an EMG study but was unable to complete the procedure due to worsening tingling sensation during the test.  He was prescribed gabapentin in the past and currently has an adequate supply of this medication.  Mr. Colegrove has been informed that this initial visit was an evaluation only.  On the follow up appointment I will go over the results, including ordered tests and available interventional therapies. At that time he will have the opportunity to decide whether to proceed with offered therapies or not. In the event that Mr. Hemann prefers avoiding interventional options, this will conclude our involvement in the case.  Medication management recommendations may be provided upon request.  Patient informed that diagnostic tests may be ordered to assist in identifying underlying causes, narrow the list of differential diagnoses and aid in determining candidacy for (or contraindications to) planned therapeutic interventions.  Historic Controlled Substance Pharmacotherapy Review PMP and historical list of controlled substances:  Gabapentin 800 Mg Tablet, Tramadol  Hcl 50 Mg Tablet Pregabalin 50 Mg Capsule  Most recently prescribed controlled substance(s): Opioid Analgesic: Gabapentin 800 Mg Tablet MME/day:  mg/day Tramadol  Hcl 50 Mg Tablet (MME=50) Historical Monitoring: The patient  reports current drug use. Drug: Marijuana. List of prior UDS Testing: No results found for: MDMA, COCAINSCRNUR, PCPSCRNUR, PCPQUANT, CANNABQUANT, THCU, ETH, CBDTHCR, D8THCCBX, D9THCCBX Historical Background Evaluation: [place] PMP: PDMP reviewed during this encounter. Review of the past 98-months conducted.             PMP NARX Score Report:  Narcotic: 110 Sedative: 100 Stimulant: 000 [place] Department of public safety, offender search: Engineer, mining Information) Non-contributory Risk Assessment  Profile: Aberrant behavior: None observed or detected today Risk factors for fatal opioid overdose: None identified today PMP NARX Overdose Risk Score: 390 Fatal overdose hazard ratio (HR): Calculation deferred Non-fatal overdose hazard ratio (HR): Calculation deferred Risk of opioid abuse or dependence: 0.7-3.0% with doses <= 36 MME/day and 6.1-26% with doses >= 120 MME/day. Substance use disorder (SUD) risk level: See below Personal History of Substance Abuse (SUD-Substance use disorder):  Alcohol: Negative  Illegal Drugs: Positive Male or Male (Marijuana)  Rx Drugs: Negative  ORT Risk Level calculation: Moderate Risk  Opioid Risk Tool - 08/24/24 1406       Family History of Substance Abuse   Alcohol Negative   Patient is adopted   Illegal Drugs Negative    Rx Drugs Negative      Personal History of Substance Abuse   Alcohol Negative    Illegal Drugs Positive Male or Male   Marijuana   Rx Drugs Negative      Age   Age between 16-45 years  No      History of Preadolescent Sexual Abuse   History of Preadolescent Sexual Abuse Negative or Male      Psychological Disease   Psychological Disease Negative    Depression Negative      Total Score   Opioid Risk Tool Scoring 4    Opioid Risk Interpretation Moderate Risk         ORT Scoring interpretation table:  Score <3 = Low Risk for SUD  Score between 4-7 = Moderate Risk for SUD  Score >8 = High Risk for Opioid Abuse   PHQ-2 Depression Scale:  Total score: 0  PHQ-2 Scoring interpretation table: (Score and probability of major depressive disorder)  Score 0 = No depression  Score 1 = 15.4% Probability  Score 2 = 21.1% Probability  Score 3 = 38.4% Probability  Score 4 = 45.5% Probability  Score 5 = 56.4% Probability  Score 6 = 78.6% Probability   PHQ-9 Depression Scale:  Total score: 0  PHQ-9 Scoring interpretation table:  Score 0-4 = No depression  Score 5-9 = Mild depression  Score 10-14 = Moderate  depression  Score 15-19 = Moderately severe depression  Score 20-27 = Severe depression (2.4 times higher risk of SUD and 2.89 times higher risk of overuse)   Pharmacologic Plan: As per protocol, I have not taken over any controlled substance management, pending the results of ordered tests and/or consults.            Initial impression: Pending review of available data and ordered tests.  Meds   Current Outpatient Medications:    atorvastatin (LIPITOR) 40 MG tablet, Take 40 mg by mouth., Disp: , Rfl:    Continuous Glucose Sensor (DEXCOM G7 SENSOR) MISC, SMARTSIG:Every 10 Days, Disp: , Rfl:    FARXIGA 10 MG TABS tablet, Take 10 mg by mouth daily., Disp: , Rfl:    gabapentin (NEURONTIN) 800 MG tablet, Take 800 mg by mouth 3 (three) times daily., Disp: , Rfl:    ibuprofen  (ADVIL ) 600 MG tablet, Take 1 tablet (600 mg total) by mouth every 6 (six) hours as needed for moderate pain (pain score 4-6)., Disp: 30 tablet, Rfl: 0   LANTUS  SOLOSTAR 100 UNIT/ML Solostar Pen, Inject 20 Units into the skin., Disp: , Rfl:    insulin  lispro (HUMALOG) 100 UNIT/ML KwikPen, INJECT UP TO 50 UNITS IN DIVIDED DOSES, AS PRESCRIBED (Patient not taking: Reported on 08/24/2024), Disp: , Rfl:    traMADol  (ULTRAM ) 50 MG tablet, Take 1 tablet (50 mg total) by mouth every 6 (six) hours as needed. (Patient not taking: Reported on 08/24/2024), Disp: 15 tablet, Rfl: 0  Imaging Review  DG Lumbar Spine 2-3 Views  Narrative CLINICAL DATA:  Chronic low back pain  EXAM: LUMBAR SPINE - 2-3 VIEW  COMPARISON:  None.  FINDINGS: Five lumbar type vertebrae are present. No acute fracture or traumatic listhesis. No significant discogenic or facet degenerative change. Included portions of the pelvis are unremarkable. Punctate radiodensity over the lower pole left kidney which may reflect a small collecting system calculus.  IMPRESSION: 1. Normal lumbar spine radiographs. 2. Punctate radiodensity over the lower pole left kidney  may reflect a small collecting system calculus.   Electronically Signed By: Gretel Edis M.D. On: 10/11/2019 17:23   DG Hand Complete Right  Narrative CLINICAL DATA:  28 year old male punched a refrigerator.  EXAM: RIGHT HAND - COMPLETE 3+ VIEW  COMPARISON:  None.  FINDINGS: There is no evidence of fracture or dislocation. There is no evidence of arthropathy or other focal bone abnormality. Soft tissues are unremarkable.  IMPRESSION: Negative.   Electronically Signed By: Vanetta Chou M.D. On: 10/21/2019 13:46   Complexity Note: Imaging results reviewed.                         ROS  Cardiovascular: No reported cardiovascular signs or symptoms such as High blood pressure, coronary artery disease, abnormal heart rate or rhythm, heart attack, blood thinner therapy or heart weakness and/or failure Pulmonary or Respiratory: Smoking Neurological: Abnormal skin sensations (Peripheral Neuropathy) Psychological-Psychiatric: No reported psychological or psychiatric signs or symptoms such as difficulty sleeping, anxiety, depression, delusions or hallucinations (schizophrenial), mood swings (bipolar disorders) or suicidal ideations or attempts Gastrointestinal: No  reported gastrointestinal signs or symptoms such as vomiting or evacuating blood, reflux, heartburn, alternating episodes of diarrhea and constipation, inflamed or scarred liver, or pancreas or irrregular and/or infrequent bowel movements Genitourinary: No reported renal or genitourinary signs or symptoms such as difficulty voiding or producing urine, peeing blood, non-functioning kidney, kidney stones, difficulty emptying the bladder, difficulty controlling the flow of urine, or chronic kidney disease Hematological: No reported hematological signs or symptoms such as prolonged bleeding, low or poor functioning platelets, bruising or bleeding easily, hereditary bleeding problems, low energy levels due to low hemoglobin or  being anemic Endocrine: High blood sugar requiring insulin  (IDDM) Rheumatologic: No reported rheumatological signs and symptoms such as fatigue, joint pain, tenderness, swelling, redness, heat, stiffness, decreased range of motion, with or without associated rash Musculoskeletal: Negative for myasthenia gravis, muscular dystrophy, multiple sclerosis or malignant hyperthermia Work History: Working part time  Allergies  Mr. Urbas is allergic to bupropion, citalopram, dayquil [pseudoephedrine-apap-dm], morphine and codeine, and nyquil multi-symptom [pseudoeph-doxylamine-dm-apap].  Laboratory Chemistry Profile   Renal Lab Results  Component Value Date   BUN 17 04/10/2024   CREATININE 0.78 04/10/2024   GFRAA >60 05/03/2020   GFRNONAA >60 04/10/2024   PROTEINUR 30 (A) 04/09/2024     Electrolytes Lab Results  Component Value Date   NA 137 04/10/2024   K 3.3 (L) 04/10/2024   CL 108 04/10/2024   CALCIUM 9.0 04/10/2024   MG 2.2 04/10/2024   PHOS 1.3 (L) 04/10/2024     Hepatic Lab Results  Component Value Date   AST 18 04/09/2024   ALT 19 04/09/2024   ALBUMIN 4.3 04/09/2024   ALKPHOS 111 04/09/2024   LIPASE 20 04/09/2024     ID Lab Results  Component Value Date   HIV Non Reactive 04/09/2024   SARSCOV2NAA NEGATIVE 04/09/2024     Bone No results found for: VD25OH, VD125OH2TOT, CI6874NY7, CI7874NY7, 25OHVITD1, 25OHVITD2, 25OHVITD3, TESTOFREE, TESTOSTERONE   Endocrine Lab Results  Component Value Date   GLUCOSE 186 (H) 04/10/2024   GLUCOSEU >=500 (A) 04/09/2024   HGBA1C 11.6 (H) 04/10/2024     Neuropathy Lab Results  Component Value Date   HGBA1C 11.6 (H) 04/10/2024   HIV Non Reactive 04/09/2024     CNS No results found for: COLORCSF, APPEARCSF, RBCCOUNTCSF, WBCCSF, POLYSCSF, LYMPHSCSF, EOSCSF, PROTEINCSF, GLUCCSF, JCVIRUS, CSFOLI, IGGCSF, LABACHR, ACETBL   Inflammation (CRP: Acute  ESR: Chronic) Lab Results   Component Value Date   LATICACIDVEN 1.4 04/09/2024     Rheumatology No results found for: RF, ANA, LABURIC, URICUR, LYMEIGGIGMAB, LYMEABIGMQN, HLAB27   Coagulation Lab Results  Component Value Date   INR 1.2 04/09/2024   LABPROT 15.5 (H) 04/09/2024   PLT 386 04/10/2024     Cardiovascular Lab Results  Component Value Date   HGB 12.7 (L) 04/10/2024   HCT 34.4 (L) 04/10/2024     Screening Lab Results  Component Value Date   SARSCOV2NAA NEGATIVE 04/09/2024   HIV Non Reactive 04/09/2024     Cancer No results found for: CEA, CA125, LABCA2   Allergens No results found for: ALMOND, APPLE, ASPARAGUS, AVOCADO, BANANA, BARLEY, BASIL, BAYLEAF, GREENBEAN, LIMABEAN, WHITEBEAN, BEEFIGE, REDBEET, BLUEBERRY, BROCCOLI, CABBAGE, MELON, CARROT, CASEIN, CASHEWNUT, CAULIFLOWER, CELERY     Note: Lab results reviewed.  PFSH  Drug: Mr. Giroux  reports current drug use. Drug: Marijuana. Alcohol:  reports current alcohol use. Tobacco:  reports that he has quit smoking. His smoking use included e-cigarettes and cigarettes. He has quit using smokeless tobacco. Medical:  has a  past medical history of Diabetes mellitus without complication (HCC). Family: family history is not on file.  History reviewed. No pertinent surgical history. 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 In no apparent acute distress Vitals:    08/24/24 1347  BP: 123/85  Pulse: (!) 110  Resp: 18  Temp: 98.2 F (36.8 C)  TempSrc: Temporal  SpO2: 100%  Weight: 160 lb (72.6 kg)  Height: 5' 7 (1.702 m)   BMI Assessment: Estimated body mass index is 25.06 kg/m as calculated from the following:   Height as of this encounter: 5' 7 (1.702 m).   Weight as of this encounter: 160 lb (72.6 kg).  BMI interpretation table: BMI level Category Range association with higher incidence of chronic pain  <18 kg/m2 Underweight   18.5-24.9 kg/m2 Ideal body weight   25-29.9 kg/m2 Overweight Increased incidence by 20%  30-34.9 kg/m2 Obese (Class I) Increased incidence by 68%  35-39.9 kg/m2 Severe obesity (Class II) Increased incidence by 136%  >40 kg/m2 Extreme obesity (Class III) Increased incidence by 254%   Patient's current BMI Ideal Body weight  Body mass index is 25.06 kg/m. Ideal body weight: 66.1 kg (145 lb 11.6 oz) Adjusted ideal body weight: 68.7 kg (151 lb 7 oz)   BMI Readings from Last 4 Encounters:  08/24/24 25.06 kg/m  04/09/24 23.49 kg/m  04/08/24 25.06 kg/m  04/06/24 25.69 kg/m   Wt Readings from Last 4 Encounters:  08/24/24 160 lb (72.6 kg)  04/09/24 150 lb (68 kg)  04/08/24 160 lb (72.6 kg)  04/06/24 164 lb (74.4 kg)    Psych/Mental status: Alert, oriented x 3 (person, place, & time)       Eyes: PERLA Respiratory: No evidence of acute respiratory distress  Lumbar Exam  Skin & Axial Inspection: No masses, redness, or swelling Alignment: Symmetrical Functional ROM: Pain restricted ROM       Stability: No instability detected Muscle Tone/Strength: Functionally intact. No obvious neuro-muscular anomalies detected. Sensory (Neurological): Referred pain pattern Palpation: No palpable anomalies       Provocative Tests: Hyperextension/rotation test: deferred today       Lumbar quadrant test (Kemp's test): deferred today       Lateral bending test: (+) due to pain. Patrick's Maneuver: (+) for bilateral S-I  arthralgia             FABER* test: (+) for bilateral S-I arthralgia              Assessment  Primary Diagnosis & Pertinent Problem List: The primary encounter diagnosis was Diabetic ketoacidosis without coma associated with type 2 diabetes mellitus (HCC). 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  Visit Diagnosis (New problems to examiner): 1. Diabetic ketoacidosis without coma associated with type 2 diabetes mellitus (HCC)   2. Metabolic acidosis   3. Chronic pain syndrome   4. 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 Ta was reminded that as per protocol, today's visit has been an evaluation only. We have not taken over the patient's controlled substance management.  Problem-specific plan: Assessment and Plan   Plan: Lumbar MRI order for further evaluation and possible L-ESI after MRI study.  Routine UDS ordered today. Schedule in 1 week to review order test.   Lab Orders         Compliance Drug Analysis, Ur     Imaging Orders         MR LUMBAR SPINE WO CONTRAST     Referral Orders  No referral(s) requested today   Procedure Orders         NEUROLYSIS     Pharmacotherapy (current): Medications ordered:  No orders of the defined types were placed in this encounter.  Medications administered during this visit: Marcus RONAL Luci Beverley had no medications administered during this visit.   Analgesic Pharmacotherapy:  Opioid Analgesics: For patients currently taking or requesting to take opioid analgesics, in accordance with [place]  Medical Board Guidelines, we will assess their risks and indications for the use of these substances. After completing our evaluation, we may offer recommendations, but we no longer take patients for medication management. The prescribing physician will ultimately decide, based on his/her  training and level of comfort whether to adopt any of the recommendations, including whether or not to prescribe such medicines.  Membrane stabilizer: To be determined at a later time  Muscle relaxant: To be determined at a later time  NSAID: To be determined at a later time  Other analgesic(s): To be determined at a later time   Interventional management options: Mr. Brechtel was informed that there is no guarantee that he would be a candidate for interventional therapies. The decision will be based on the results of diagnostic studies, as well as Mr. Overacker's risk profile.  Procedure(s) under consideration:  Pending results of ordered studies     Interventional Therapies  Risk Factors  Considerations  Medical Comorbidities:     Planned  Pending:      Under consideration:   Pending   Completed: (Analgesic benefit)1  None at this time   Therapeutic  Palliative (PRN) options:   None established   Completed by other providers:   None reported  1(Analgesic benefit): Expressed in percentage (%). (Local anesthetic[LA] +/- sedation  L.A.Local Anesthetic  Steroid benefit  Ongoing benefit)   Provider-requested follow-up: Return in about 1 week (around 08/31/2024) for  (Clinic): (B) Qutenza # 1 with Dr. marcelino and , review of ordered tests (MRI).  No future appointments. I discussed the assessment and treatment plan with the patient. The patient was provided an opportunity to ask questions and all were answered. The patient agreed with the plan and demonstrated an understanding of the instructions.  Patient advised to call back or seek an in-person evaluation if the symptoms or condition worsens.  I personally spent a total of 45 minutes in the care of the patient today including preparing to see the patient, getting/reviewing separately obtained history, performing a medically appropriate exam/evaluation, counseling and educating, placing orders, documenting clinical  information in the EHR, independently interpreting results, communicating results, and coordinating care.   Duration of encounter:  minutes.  Total time on encounter, as per AMA guidelines included both the face-to-face and non-face-to-face time personally spent by the physician and/or other qualified health care professional(s) on the day of the encounter (includes time in activities that  require the physician or other qualified health care professional and does not include time in activities normally performed by clinical staff). Physician's time may include the following activities when performed: Preparing to see the patient (e.g., pre-charting review of records, searching for previously ordered imaging, lab work, and nerve conduction tests) Review of prior analgesic pharmacotherapies. Reviewing PMP Interpreting ordered tests (e.g., lab work, imaging, nerve conduction tests) Performing post-procedure evaluations, including interpretation of diagnostic procedures Obtaining and/or reviewing separately obtained history Performing a medically appropriate examination and/or evaluation Counseling and educating the patient/family/caregiver Ordering medications, tests, or procedures Referring and communicating with other health care professionals (when not separately reported) Documenting clinical information in the electronic or other health record Independently interpreting results (not separately reported) and communicating results to the patient/ family/caregiver Care coordination (not separately reported)  Note by: Emmy MARLA Blanch, NP  Date: 08/24/2024; Time: 3:07 PM

## 2024-08-27 LAB — COMPLIANCE DRUG ANALYSIS, UR

## 2024-09-03 ENCOUNTER — Telehealth: Payer: Self-pay

## 2024-09-03 ENCOUNTER — Other Ambulatory Visit: Payer: Self-pay | Admitting: Nurse Practitioner

## 2024-09-03 DIAGNOSIS — M5441 Lumbago with sciatica, right side: Secondary | ICD-10-CM

## 2024-09-03 DIAGNOSIS — G894 Chronic pain syndrome: Secondary | ICD-10-CM

## 2024-09-03 NOTE — Telephone Encounter (Signed)
 Insurance denied his MRI due to no physical therapy. I left the patient a vm letting him know. What do you want to do from here?

## 2024-09-03 NOTE — Telephone Encounter (Signed)
 Tried contacting patient, no answer and vm not set up
